# Patient Record
Sex: Male | Born: 1950
Health system: Southern US, Community
[De-identification: ages and names within clinical notes are randomized; demographics above are authoritative.]

## PROBLEM LIST (undated history)

## (undated) DIAGNOSIS — T4145XA Adverse effect of unspecified anesthetic, initial encounter: Secondary | ICD-10-CM

## (undated) DIAGNOSIS — T7840XA Allergy, unspecified, initial encounter: Secondary | ICD-10-CM

## (undated) DIAGNOSIS — Z87442 Personal history of urinary calculi: Secondary | ICD-10-CM

## (undated) DIAGNOSIS — Z8701 Personal history of pneumonia (recurrent): Secondary | ICD-10-CM

## (undated) DIAGNOSIS — N189 Chronic kidney disease, unspecified: Secondary | ICD-10-CM

## (undated) DIAGNOSIS — C61 Malignant neoplasm of prostate: Secondary | ICD-10-CM

## (undated) DIAGNOSIS — H269 Unspecified cataract: Secondary | ICD-10-CM

## (undated) DIAGNOSIS — T8859XA Other complications of anesthesia, initial encounter: Secondary | ICD-10-CM

## (undated) DIAGNOSIS — Z8719 Personal history of other diseases of the digestive system: Secondary | ICD-10-CM

## (undated) DIAGNOSIS — E785 Hyperlipidemia, unspecified: Secondary | ICD-10-CM

## (undated) DIAGNOSIS — R7303 Prediabetes: Secondary | ICD-10-CM

## (undated) DIAGNOSIS — I10 Essential (primary) hypertension: Secondary | ICD-10-CM

## (undated) HISTORY — PX: POLYPECTOMY: SHX149

## (undated) HISTORY — DX: Hyperlipidemia, unspecified: E78.5

## (undated) HISTORY — PX: SHOULDER SURGERY: SHX246

## (undated) HISTORY — DX: Chronic kidney disease, unspecified: N18.9

## (undated) HISTORY — DX: Personal history of other diseases of the digestive system: Z87.19

## (undated) HISTORY — DX: Personal history of urinary calculi: Z87.442

## (undated) HISTORY — DX: Essential (primary) hypertension: I10

## (undated) HISTORY — PX: COLONOSCOPY: SHX174

## (undated) HISTORY — DX: Unspecified cataract: H26.9

## (undated) HISTORY — DX: Allergy, unspecified, initial encounter: T78.40XA

## (undated) HISTORY — DX: Personal history of pneumonia (recurrent): Z87.01

## (undated) HISTORY — DX: Prediabetes: R73.03

---

## 1999-02-23 ENCOUNTER — Inpatient Hospital Stay (HOSPITAL_COMMUNITY): Admission: EM | Admit: 1999-02-23 | Discharge: 1999-02-27 | Payer: Self-pay

## 1999-10-29 ENCOUNTER — Encounter: Payer: Self-pay | Admitting: Gastroenterology

## 2003-01-13 ENCOUNTER — Emergency Department (HOSPITAL_COMMUNITY): Admission: EM | Admit: 2003-01-13 | Discharge: 2003-01-13 | Payer: Self-pay | Admitting: *Deleted

## 2004-01-20 ENCOUNTER — Ambulatory Visit: Payer: Self-pay | Admitting: Internal Medicine

## 2004-01-31 ENCOUNTER — Ambulatory Visit: Payer: Self-pay | Admitting: Internal Medicine

## 2004-02-05 ENCOUNTER — Inpatient Hospital Stay (HOSPITAL_COMMUNITY): Admission: EM | Admit: 2004-02-05 | Discharge: 2004-02-07 | Payer: Self-pay | Admitting: Emergency Medicine

## 2004-02-09 ENCOUNTER — Ambulatory Visit: Payer: Self-pay | Admitting: Internal Medicine

## 2004-02-17 ENCOUNTER — Ambulatory Visit: Payer: Self-pay | Admitting: Internal Medicine

## 2004-02-20 ENCOUNTER — Ambulatory Visit: Payer: Self-pay | Admitting: Gastroenterology

## 2004-03-19 ENCOUNTER — Ambulatory Visit: Payer: Self-pay | Admitting: Internal Medicine

## 2004-04-27 ENCOUNTER — Ambulatory Visit: Payer: Self-pay | Admitting: Internal Medicine

## 2004-12-10 ENCOUNTER — Ambulatory Visit: Payer: Self-pay | Admitting: Gastroenterology

## 2004-12-17 ENCOUNTER — Ambulatory Visit: Payer: Self-pay | Admitting: Gastroenterology

## 2004-12-17 ENCOUNTER — Encounter (INDEPENDENT_AMBULATORY_CARE_PROVIDER_SITE_OTHER): Payer: Self-pay | Admitting: *Deleted

## 2005-02-06 ENCOUNTER — Ambulatory Visit: Payer: Self-pay | Admitting: Internal Medicine

## 2005-02-15 ENCOUNTER — Ambulatory Visit: Payer: Self-pay | Admitting: Internal Medicine

## 2005-08-16 ENCOUNTER — Ambulatory Visit: Payer: Self-pay | Admitting: Internal Medicine

## 2005-11-11 ENCOUNTER — Ambulatory Visit: Payer: Self-pay | Admitting: Internal Medicine

## 2006-04-14 ENCOUNTER — Emergency Department (HOSPITAL_COMMUNITY): Admission: EM | Admit: 2006-04-14 | Discharge: 2006-04-14 | Payer: Self-pay | Admitting: Family Medicine

## 2006-04-23 ENCOUNTER — Emergency Department (HOSPITAL_COMMUNITY): Admission: EM | Admit: 2006-04-23 | Discharge: 2006-04-23 | Payer: Self-pay | Admitting: Family Medicine

## 2006-12-11 ENCOUNTER — Ambulatory Visit: Payer: Self-pay | Admitting: Internal Medicine

## 2006-12-11 LAB — CONVERTED CEMR LAB
ALT: 31 units/L (ref 0–53)
AST: 24 units/L (ref 0–37)
Albumin: 4 g/dL (ref 3.5–5.2)
Alkaline Phosphatase: 59 units/L (ref 39–117)
BUN: 13 mg/dL (ref 6–23)
Basophils Absolute: 0.1 10*3/uL (ref 0.0–0.1)
Basophils Relative: 0.9 % (ref 0.0–1.0)
Bilirubin Urine: NEGATIVE
Bilirubin, Direct: 0.1 mg/dL (ref 0.0–0.3)
CO2: 32 meq/L (ref 19–32)
Calcium: 9.3 mg/dL (ref 8.4–10.5)
Chloride: 103 meq/L (ref 96–112)
Cholesterol: 175 mg/dL (ref 0–200)
Creatinine, Ser: 1.1 mg/dL (ref 0.4–1.5)
Eosinophils Absolute: 0.7 10*3/uL — ABNORMAL HIGH (ref 0.0–0.6)
Eosinophils Relative: 10.4 % — ABNORMAL HIGH (ref 0.0–5.0)
GFR calc Af Amer: 89 mL/min
GFR calc non Af Amer: 73 mL/min
Glucose, Bld: 102 mg/dL — ABNORMAL HIGH (ref 70–99)
Glucose, Urine, Semiquant: NEGATIVE
HCT: 44.4 % (ref 39.0–52.0)
HDL: 36.5 mg/dL — ABNORMAL LOW (ref >39.0)
Hemoglobin: 15.3 g/dL (ref 13.0–17.0)
Ketones, urine, test strip: NEGATIVE
LDL Cholesterol: 111 mg/dL — ABNORMAL HIGH (ref 0–99)
Lymphocytes Relative: 24.8 % (ref 12.0–46.0)
MCHC: 34.4 g/dL (ref 30.0–36.0)
MCV: 88 fL (ref 78.0–100.0)
Monocytes Absolute: 0.7 10*3/uL (ref 0.2–0.7)
Monocytes Relative: 10.7 % (ref 3.0–11.0)
Neutro Abs: 3.2 10*3/uL (ref 1.4–7.7)
Neutrophils Relative %: 53.2 % (ref 43.0–77.0)
Nitrite: NEGATIVE
PSA: 3.02 ng/mL (ref 0.10–4.00)
Platelets: 273 10*3/uL (ref 150–400)
Potassium: 4.2 meq/L (ref 3.5–5.1)
RBC: 5.05 M/uL (ref 4.22–5.81)
RDW: 13 % (ref 11.5–14.6)
Sodium: 142 meq/L (ref 135–145)
Specific Gravity, Urine: 1.02
TSH: 2.75 microintl units/mL (ref 0.35–5.50)
Total Bilirubin: 1 mg/dL (ref 0.3–1.2)
Total CHOL/HDL Ratio: 4.8
Total Protein: 7 g/dL (ref 6.0–8.3)
Triglycerides: 137 mg/dL (ref 0–149)
Urobilinogen, UA: 0.2
VLDL: 27 mg/dL (ref 0–40)
WBC: 6.3 10*3/uL (ref 4.5–10.5)
pH: 7

## 2006-12-24 ENCOUNTER — Ambulatory Visit: Payer: Self-pay | Admitting: Internal Medicine

## 2006-12-24 DIAGNOSIS — I10 Essential (primary) hypertension: Secondary | ICD-10-CM | POA: Insufficient documentation

## 2006-12-24 DIAGNOSIS — R7301 Impaired fasting glucose: Secondary | ICD-10-CM | POA: Insufficient documentation

## 2006-12-24 DIAGNOSIS — Z87442 Personal history of urinary calculi: Secondary | ICD-10-CM | POA: Insufficient documentation

## 2006-12-24 DIAGNOSIS — E785 Hyperlipidemia, unspecified: Secondary | ICD-10-CM | POA: Insufficient documentation

## 2006-12-24 DIAGNOSIS — G479 Sleep disorder, unspecified: Secondary | ICD-10-CM | POA: Insufficient documentation

## 2006-12-24 LAB — CONVERTED CEMR LAB: Blood Glucose, Fingerstick: 94

## 2007-03-02 ENCOUNTER — Ambulatory Visit: Payer: Self-pay | Admitting: Internal Medicine

## 2007-07-06 ENCOUNTER — Ambulatory Visit: Payer: Self-pay | Admitting: Internal Medicine

## 2007-07-06 DIAGNOSIS — R972 Elevated prostate specific antigen [PSA]: Secondary | ICD-10-CM | POA: Insufficient documentation

## 2007-07-06 LAB — CONVERTED CEMR LAB
BUN: 14 mg/dL (ref 6–23)
CO2: 30 meq/L (ref 19–32)
Calcium: 9.2 mg/dL (ref 8.4–10.5)
Chloride: 104 meq/L (ref 96–112)
Cholesterol: 151 mg/dL (ref 0–200)
Creatinine, Ser: 1.1 mg/dL (ref 0.4–1.5)
GFR calc Af Amer: 89 mL/min
GFR calc non Af Amer: 73 mL/min
Glucose, Bld: 97 mg/dL (ref 70–99)
HDL: 34.1 mg/dL — ABNORMAL LOW (ref >39.0)
LDL Cholesterol: 102 mg/dL — ABNORMAL HIGH (ref 0–99)
PSA: 3.23 ng/mL (ref 0.10–4.00)
Potassium: 4.5 meq/L (ref 3.5–5.1)
Sodium: 142 meq/L (ref 135–145)
Total CHOL/HDL Ratio: 4.4
Triglycerides: 73 mg/dL (ref 0–149)
VLDL: 15 mg/dL (ref 0–40)

## 2007-07-13 ENCOUNTER — Ambulatory Visit: Payer: Self-pay | Admitting: Internal Medicine

## 2007-07-13 DIAGNOSIS — N529 Male erectile dysfunction, unspecified: Secondary | ICD-10-CM | POA: Insufficient documentation

## 2007-07-13 DIAGNOSIS — Z8719 Personal history of other diseases of the digestive system: Secondary | ICD-10-CM | POA: Insufficient documentation

## 2007-07-13 DIAGNOSIS — N400 Enlarged prostate without lower urinary tract symptoms: Secondary | ICD-10-CM | POA: Insufficient documentation

## 2007-11-24 ENCOUNTER — Ambulatory Visit: Payer: Self-pay | Admitting: Internal Medicine

## 2007-11-25 ENCOUNTER — Ambulatory Visit: Payer: Self-pay | Admitting: Internal Medicine

## 2007-12-28 ENCOUNTER — Ambulatory Visit: Payer: Self-pay | Admitting: Internal Medicine

## 2007-12-28 LAB — CONVERTED CEMR LAB
ALT: 25 units/L (ref 0–53)
AST: 23 units/L (ref 0–37)
Albumin: 3.7 g/dL (ref 3.5–5.2)
Alkaline Phosphatase: 58 units/L (ref 39–117)
BUN: 14 mg/dL (ref 6–23)
Basophils Absolute: 0 10*3/uL (ref 0.0–0.1)
Basophils Relative: 0.8 % (ref 0.0–3.0)
Bilirubin Urine: NEGATIVE
Bilirubin, Direct: 0.1 mg/dL (ref 0.0–0.3)
CO2: 32 meq/L (ref 19–32)
Calcium: 9.2 mg/dL (ref 8.4–10.5)
Chloride: 103 meq/L (ref 96–112)
Cholesterol: 183 mg/dL (ref 0–200)
Creatinine, Ser: 1.1 mg/dL (ref 0.4–1.5)
Eosinophils Absolute: 0.6 10*3/uL (ref 0.0–0.7)
Eosinophils Relative: 10.8 % — ABNORMAL HIGH (ref 0.0–5.0)
GFR calc Af Amer: 88 mL/min
GFR calc non Af Amer: 73 mL/min
Glucose, Bld: 113 mg/dL — ABNORMAL HIGH (ref 70–99)
Glucose, Urine, Semiquant: NEGATIVE
HCT: 44.9 % (ref 39.0–52.0)
HDL: 45.7 mg/dL (ref >39.0)
Hemoglobin: 15.2 g/dL (ref 13.0–17.0)
Ketones, urine, test strip: NEGATIVE
LDL Cholesterol: 111 mg/dL — ABNORMAL HIGH (ref 0–99)
Lymphocytes Relative: 25.3 % (ref 12.0–46.0)
MCHC: 33.9 g/dL (ref 30.0–36.0)
MCV: 87.5 fL (ref 78.0–100.0)
Monocytes Absolute: 0.6 10*3/uL (ref 0.1–1.0)
Monocytes Relative: 10.2 % (ref 3.0–12.0)
Neutro Abs: 3 10*3/uL (ref 1.4–7.7)
Neutrophils Relative %: 52.9 % (ref 43.0–77.0)
Nitrite: NEGATIVE
PSA: 2.81 ng/mL (ref 0.10–4.00)
Platelets: 233 10*3/uL (ref 150–400)
Potassium: 4 meq/L (ref 3.5–5.1)
Protein, U semiquant: NEGATIVE
RBC: 5.13 M/uL (ref 4.22–5.81)
RDW: 13 % (ref 11.5–14.6)
Sodium: 143 meq/L (ref 135–145)
Specific Gravity, Urine: 1.02
TSH: 3.38 microintl units/mL (ref 0.35–5.50)
Total Bilirubin: 0.8 mg/dL (ref 0.3–1.2)
Total CHOL/HDL Ratio: 4
Total Protein: 7.1 g/dL (ref 6.0–8.3)
Triglycerides: 132 mg/dL (ref 0–149)
Urobilinogen, UA: 0.2
VLDL: 26 mg/dL (ref 0–40)
WBC Urine, dipstick: NEGATIVE
WBC: 5.6 10*3/uL (ref 4.5–10.5)
pH: 5.5

## 2008-01-04 ENCOUNTER — Ambulatory Visit: Payer: Self-pay | Admitting: Internal Medicine

## 2008-05-26 ENCOUNTER — Ambulatory Visit: Payer: Self-pay | Admitting: Internal Medicine

## 2008-05-26 LAB — CONVERTED CEMR LAB: Hgb A1c MFr Bld: 6.2 % (ref 4.6–6.5)

## 2008-06-02 ENCOUNTER — Ambulatory Visit: Payer: Self-pay | Admitting: Internal Medicine

## 2008-08-04 ENCOUNTER — Ambulatory Visit: Payer: Self-pay | Admitting: Internal Medicine

## 2008-08-12 LAB — CONVERTED CEMR LAB
BUN: 16 mg/dL (ref 6–23)
CO2: 27 meq/L (ref 19–32)
Calcium: 8.7 mg/dL (ref 8.4–10.5)
Chloride: 107 meq/L (ref 96–112)
Creatinine, Ser: 1 mg/dL (ref 0.4–1.5)
GFR calc non Af Amer: 81.38 mL/min (ref >60)
Glucose, Bld: 131 mg/dL — ABNORMAL HIGH (ref 70–99)
Potassium: 3.2 meq/L — ABNORMAL LOW (ref 3.5–5.1)
Sodium: 141 meq/L (ref 135–145)

## 2008-12-12 ENCOUNTER — Telehealth: Payer: Self-pay | Admitting: Internal Medicine

## 2008-12-12 ENCOUNTER — Telehealth: Payer: Self-pay | Admitting: Family Medicine

## 2008-12-12 ENCOUNTER — Ambulatory Visit: Payer: Self-pay | Admitting: Internal Medicine

## 2008-12-12 LAB — CONVERTED CEMR LAB: Hemoglobin: 14.7 g/dL

## 2008-12-13 ENCOUNTER — Ambulatory Visit: Payer: Self-pay | Admitting: Gastroenterology

## 2009-01-02 ENCOUNTER — Ambulatory Visit: Payer: Self-pay | Admitting: Internal Medicine

## 2009-01-02 LAB — CONVERTED CEMR LAB
ALT: 27 units/L (ref 0–53)
AST: 28 units/L (ref 0–37)
Albumin: 3.8 g/dL (ref 3.5–5.2)
Alkaline Phosphatase: 56 units/L (ref 39–117)
BUN: 22 mg/dL (ref 6–23)
Basophils Absolute: 0 10*3/uL (ref 0.0–0.1)
Basophils Relative: 0.6 % (ref 0.0–3.0)
Bilirubin Urine: NEGATIVE
Bilirubin, Direct: 0 mg/dL (ref 0.0–0.3)
CO2: 31 meq/L (ref 19–32)
Calcium: 9 mg/dL (ref 8.4–10.5)
Chloride: 104 meq/L (ref 96–112)
Cholesterol: 162 mg/dL (ref 0–200)
Creatinine, Ser: 1.1 mg/dL (ref 0.4–1.5)
Eosinophils Absolute: 0.5 10*3/uL (ref 0.0–0.7)
Eosinophils Relative: 8 % — ABNORMAL HIGH (ref 0.0–5.0)
GFR calc non Af Amer: 72.8 mL/min (ref >60)
Glucose, Bld: 109 mg/dL — ABNORMAL HIGH (ref 70–99)
Glucose, Urine, Semiquant: NEGATIVE
HCT: 41.8 % (ref 39.0–52.0)
HDL: 40.3 mg/dL (ref >39.00)
Hemoglobin: 14.1 g/dL (ref 13.0–17.0)
Ketones, urine, test strip: NEGATIVE
LDL Cholesterol: 99 mg/dL (ref 0–99)
Lymphocytes Relative: 26 % (ref 12.0–46.0)
Lymphs Abs: 1.6 10*3/uL (ref 0.7–4.0)
MCHC: 33.8 g/dL (ref 30.0–36.0)
MCV: 89.8 fL (ref 78.0–100.0)
Monocytes Absolute: 0.6 10*3/uL (ref 0.1–1.0)
Monocytes Relative: 9.6 % (ref 3.0–12.0)
Neutro Abs: 3.4 10*3/uL (ref 1.4–7.7)
Neutrophils Relative %: 55.8 % (ref 43.0–77.0)
Nitrite: NEGATIVE
PSA: 3.88 ng/mL (ref 0.10–4.00)
Platelets: 251 10*3/uL (ref 150.0–400.0)
Potassium: 3.6 meq/L (ref 3.5–5.1)
Protein, U semiquant: NEGATIVE
RBC: 4.66 M/uL (ref 4.22–5.81)
RDW: 13.2 % (ref 11.5–14.6)
Sodium: 143 meq/L (ref 135–145)
Specific Gravity, Urine: <1.005
TSH: 2.98 microintl units/mL (ref 0.35–5.50)
Total Bilirubin: 0.7 mg/dL (ref 0.3–1.2)
Total CHOL/HDL Ratio: 4
Total Protein: 7.3 g/dL (ref 6.0–8.3)
Triglycerides: 115 mg/dL (ref 0.0–149.0)
Urobilinogen, UA: 0.2
VLDL: 23 mg/dL (ref 0.0–40.0)
WBC: 6.1 10*3/uL (ref 4.5–10.5)
pH: 6

## 2009-01-09 ENCOUNTER — Ambulatory Visit: Payer: Self-pay | Admitting: Internal Medicine

## 2009-01-09 DIAGNOSIS — L538 Other specified erythematous conditions: Secondary | ICD-10-CM | POA: Insufficient documentation

## 2009-01-23 ENCOUNTER — Telehealth: Payer: Self-pay | Admitting: *Deleted

## 2009-12-19 ENCOUNTER — Encounter: Payer: Self-pay | Admitting: Gastroenterology

## 2010-01-26 ENCOUNTER — Other Ambulatory Visit: Payer: Self-pay | Admitting: Internal Medicine

## 2010-01-26 ENCOUNTER — Ambulatory Visit
Admission: RE | Admit: 2010-01-26 | Discharge: 2010-01-26 | Payer: Self-pay | Source: Home / Self Care | Attending: Internal Medicine | Admitting: Internal Medicine

## 2010-01-26 LAB — CBC WITH DIFFERENTIAL/PLATELET
Basophils Absolute: 0 10*3/uL (ref 0.0–0.1)
Basophils Relative: 0.4 % (ref 0.0–3.0)
Eosinophils Absolute: 0.5 10*3/uL (ref 0.0–0.7)
Eosinophils Relative: 6.9 % — ABNORMAL HIGH (ref 0.0–5.0)
HCT: 44.4 % (ref 39.0–52.0)
Hemoglobin: 15.1 g/dL (ref 13.0–17.0)
Lymphocytes Relative: 23.4 % (ref 12.0–46.0)
Lymphs Abs: 1.5 10*3/uL (ref 0.7–4.0)
MCHC: 34.1 g/dL (ref 30.0–36.0)
MCV: 87.2 fl (ref 78.0–100.0)
Monocytes Absolute: 0.6 10*3/uL (ref 0.1–1.0)
Monocytes Relative: 8.8 % (ref 3.0–12.0)
Neutro Abs: 4 10*3/uL (ref 1.4–7.7)
Neutrophils Relative %: 60.5 % (ref 43.0–77.0)
Platelets: 268 10*3/uL (ref 150.0–400.0)
RBC: 5.1 Mil/uL (ref 4.22–5.81)
RDW: 13.9 % (ref 11.5–14.6)
WBC: 6.5 10*3/uL (ref 4.5–10.5)

## 2010-01-26 LAB — BASIC METABOLIC PANEL
BUN: 14 mg/dL (ref 6–23)
CO2: 28 mEq/L (ref 19–32)
Calcium: 9.2 mg/dL (ref 8.4–10.5)
Chloride: 104 mEq/L (ref 96–112)
Creatinine, Ser: 1.1 mg/dL (ref 0.4–1.5)
GFR: 70.32 mL/min (ref >60.00)
Glucose, Bld: 103 mg/dL — ABNORMAL HIGH (ref 70–99)
Potassium: 4.3 mEq/L (ref 3.5–5.1)
Sodium: 144 mEq/L (ref 135–145)

## 2010-01-26 LAB — CONVERTED CEMR LAB
Bilirubin Urine: NEGATIVE
Glucose, Urine, Semiquant: NEGATIVE
Ketones, urine, test strip: NEGATIVE
Nitrite: NEGATIVE
Protein, U semiquant: NEGATIVE
Specific Gravity, Urine: 1.02
Urobilinogen, UA: 0.2
pH: 7

## 2010-01-26 LAB — LIPID PANEL
Cholesterol: 152 mg/dL (ref 0–200)
HDL: 43.6 mg/dL (ref >39.00)
LDL Cholesterol: 90 mg/dL (ref 0–99)
Total CHOL/HDL Ratio: 3
Triglycerides: 90 mg/dL (ref 0.0–149.0)
VLDL: 18 mg/dL (ref 0.0–40.0)

## 2010-01-26 LAB — HEPATIC FUNCTION PANEL
ALT: 23 U/L (ref 0–53)
AST: 24 U/L (ref 0–37)
Albumin: 3.7 g/dL (ref 3.5–5.2)
Alkaline Phosphatase: 53 U/L (ref 39–117)
Bilirubin, Direct: 0.1 mg/dL (ref 0.0–0.3)
Total Bilirubin: 0.8 mg/dL (ref 0.3–1.2)
Total Protein: 6.8 g/dL (ref 6.0–8.3)

## 2010-01-26 LAB — TSH: TSH: 2.21 u[IU]/mL (ref 0.35–5.50)

## 2010-02-05 ENCOUNTER — Ambulatory Visit
Admission: RE | Admit: 2010-02-05 | Discharge: 2010-02-05 | Payer: Self-pay | Source: Home / Self Care | Attending: Internal Medicine | Admitting: Internal Medicine

## 2010-02-05 DIAGNOSIS — E669 Obesity, unspecified: Secondary | ICD-10-CM | POA: Insufficient documentation

## 2010-02-20 NOTE — Letter (Signed)
Summary: Colonoscopy Letter  [facility]  [address]   [place], [place] [address]   Phone: [phone]  Fax: [phone]      December 19, 2009 MRN: [id number]   [facility] [address] [place], [place]  [address]   Dear Mr. Jablon,   According to your medical record, it is time for you to schedule a Colonoscopy. The American Cancer Society recommends this procedure as a method to detect early colon cancer. Patients with a family history of colon cancer, or a personal history of colon polyps or inflammatory bowel disease are at increased risk.  This letter has been generated based on the recommendations made at the time of your procedure. If you feel that in your particular situation this may no longer apply, please contact our office.  Please call our office at [phone] to schedule this appointment or to update your records at your earliest convenience.  Thank you for cooperating with [place] to provide you with the very best care possible.   Sincerely,   Barbette Hair. Arlyce Dice, [name]  [facility] [phone]

## 2010-02-20 NOTE — Progress Notes (Signed)
Summary: refill on vytorin  Phone Note Call from Patient Call back at Home Phone [phone]   Caller: Patient Summary of Call: Vytorin 10/40 needs to be sent to [facility] Initial call taken by: Romualdo Bolk, CMA (AAMA),  January 23, 2009 5:13 PM  Follow-up for Phone Call        Left a message on pt's machine about sending the rx to [facility] Follow-up by: Romualdo Bolk, CMA (AAMA),  January 23, 2009 5:15 PM    Prescriptions: VYTORIN 10-40 MG  TABS (EZETIMIBE-SIMVASTATIN) 1 by mouth once daily  #90 x 3   Entered by:   Romualdo Bolk, CMA (AAMA)   Authorized by:   Madelin Headings MD   Signed by:   Romualdo Bolk, CMA (AAMA) on 01/23/2009   Method used:   Electronically to        [facility]* (mail-order)             ,          Ph: [phone]       Fax: [phone]   RxID:   [id number]

## 2010-02-22 NOTE — Assessment & Plan Note (Signed)
Summary: CPX//SLM/PT [facility] FROM [facility]   Vital Signs:  Patient profile:   60 year old male Height:      65 inches Weight:      224 pounds BMI:     37.41 Pulse rate:   78 / minute BP sitting:   140 / 90  (left arm) Cuff size:   regular  Vitals Entered By: Romualdo Bolk, CMA (AAMA) (February 05, 2010 9:10 AM)  Nutrition Counseling: Patient's BMI is greater than 25 and therefore counseled on weight management options.  Serial Vital Signs/Assessments:  Time      Position  BP       Pulse  Resp  Temp     By                     130/82                         Madelin Headings MD  Comments: r sitting By: Madelin Headings MD   CC: CPX   History of Present Illness: Daniel Randall comes in today  for preventive visit.  Since last visit  here  there have been no major changes in health status  . BP: taking meds LIPIDS: no chang ein meds. No renal stone . No exercise intolerance  walking more and had lost more weight before the holidays . due for colonoscopy.     Preventive Care Screening  Prior Values:    PSA:  3.88 (01/02/2009)    Colonoscopy:  Results: Polyp.  Results: Diverticulosis.       Location:  [facility].   (12/17/2004)    Last Tetanus Booster:  Td (10/21/2001)   Preventive Screening-Counseling & Management  Alcohol-Tobacco     Alcohol drinks/day: <1     Alcohol type: wine     Smoking Status: never  Caffeine-Diet-Exercise     Caffeine use/day: 1-2     Does Patient Exercise: yes  Hep-HIV-STD-Contraception     Dental Visit-last 6 months yes     Sun Exposure-Excessive: no  Safety-Violence-Falls     Seat Belt Use: 100     Firearms in the Home: firearms in the home     Firearm Counseling: not indicated; uses recommended firearm safety measures     Smoke Detectors: yes  Current Medications (verified): 1)  Vytorin 10-40 Mg  Tabs (Ezetimibe-Simvastatin) .Marland Kitchen.. 1 By Mouth Once Daily 2)  Flonase 50 Mcg/act Susp (Fluticasone Propionate) .... 2  Spray Each Nares Q D 3)  Diovan Hct 320-25 Mg Tabs (Valsartan-Hydrochlorothiazide) .Marland Kitchen.. 1 By Mouth Once Daily 4)  Ketoconazole 2 % Crea (Ketoconazole) .... Apply To Rash Two Times A Day As Directed  Allergies (verified): 1)  ! Accupril 2)  ! * Bee Stings  Past History:  Past medical, surgical, family and social histories (including risk factors) reviewed, and no changes noted (except as noted below).  Past Medical History: Reviewed history from 12/13/2008 and no changes required. Hyperlipidemia Hypertension Nephrolithiasis, hx of hosp pneumonia 2001 Diverticulitis, hx of 1/06  Hx of colon polyps 2006 diverticulosis 2001       LAST Td: 10/2001 Colonscopy: 12/17/2004 EKG: 01/31/2004 [facility]  Kyra Searles  Past Surgical History: Reviewed history from 01/04/2008 and no changes required. Left Shoulder Arthroscopy  [place].  Past History:  Care Management: Dermatology: Joseph Art Gastroenterology: Arlyce Dice  Family History: Reviewed history from 01/04/2008 and no changes required. Family History of Colon CA 1st degree relative  Father Family History Diabetes 1st degree relative MOM ? sis ADHD  children   Father:  Wellsite geologist  Mother: DM Siblings:  older bro and sister  borderline DM  Social History: Reviewed history from 12/13/2008 and no changes required. Never Smoked Alcohol use-yes couple times a month Drug use-no Regular exercise-no  starting now   Married 2 cats and a dog.  HH of 3  2 older children   son at home currently  Occupation: Engineer, manufacturing systems Care w/in 6 mos.:  yes  Review of Systems       12 sytem review neg cv pulm  Gi currently orthos stable. vision and hearing ok. No cp sob or exercise limitations. nocturia x 1 -2 ocass hesitancy no blood no renal stones.  Physical Exam  General:  Well-developed,well-nourished,in no acute distress; alert,appropriate and cooperative throughout examination Head:  normocephalic, atraumatic, and no  abnormalities observed.   Eyes:  vision grossly intact, pupils equal, pupils round, and pupils reactive to light.   Ears:  R ear normal, L ear normal, and no external deformities.   Nose:  no external deformity, no external erythema, and no nasal discharge.   Mouth:  pharynx pink and moist.   Neck:  No deformities, masses, or tenderness noted. Lungs:  Normal respiratory effort, chest expands symmetrically. Lungs are clear to auscultation, no crackles or wheezes.no dullness.   Heart:  Normal rate and regular rhythm. S1 and S2 normal without gallop, murmur, click, rub or other extra sounds.no lifts.   Abdomen:  Bowel sounds positive,abdomen soft and non-tender without masses, organomegaly or hernias noted. Rectal:  no external abnormalities, normal sphincter tone, and no masses.   Prostate:  no nodules, no asymmetry, and 1+ enlarged.   Msk:  no joint swelling, no joint warmth, no redness over joints, and no joint deformities.   Pulses:  pulses intact without delay  no brutis Extremities:  no clubbing cyanosis or edema  Neurologic:  alert & oriented X3, strength normal in all extremities, and gait normal.   Skin:  turgor normal, color normal, no ecchymoses, and no petechiae.   Cervical Nodes:  No lymphadenopathy noted Axillary Nodes:  No palpable lymphadenopathy Inguinal Nodes:  No significant adenopathy Psych:  Oriented X3, normally interactive, good eye contact, not anxious appearing, and not depressed appearing.   labs reviewed with patient  Impression & Recommendations:  Problem # 1:  PREVENTIVE HEALTH CARE (ICD-V70.0)  Reviewed preventive care protocols, scheduled due services, and updated immunizations.  Problem # 2:  HYPERTENSION (ICD-401.9) repeat bp 130/82 right arm large sitting and controlled  continue losing weight His updated medication list for this problem includes:    Diovan Hct 320-25 Mg Tabs (Valsartan-hydrochlorothiazide) .Marland Kitchen... 1 by mouth once daily  BP today:  140/90 Prior BP: 110/80 (01/09/2009)  Prior 10 Yr Risk Heart Disease: 9 % (08/04/2008)  Labs Reviewed: K+: 4.3 (01/26/2010) Creat: : 1.1 (01/26/2010)   Chol: 152 (01/26/2010)   HDL: 43.60 (01/26/2010)   LDL: 90 (01/26/2010)   TG: 90.0 (01/26/2010)  Problem # 3:  OBESITY (ICD-278.00) continue weight loss  Ht: 65 (02/05/2010)   Wt: 224 (02/05/2010)   BMI: 37.41 (02/05/2010)  Problem # 4:  HYPERTROPHY PROSTATE W/O UR OBST & OTH LUTS (ICD-600.00) Assessment: Unchanged nocturia x 1-2  no change some sloght change in flow  no alarm features on exam . Expectant management .    Problem # 5:  FAMILY HISTORY OF COLON CA 1ST DEGREE RELATIVE <60 (ICD-V16.0) due for colonoscopy  Complete Medication List: 1)  Vytorin 10-40 Mg Tabs (Ezetimibe-simvastatin) .Marland Kitchen.. 1 by mouth once daily 2)  Flonase 50 Mcg/act Susp (Fluticasone propionate) .... 2 spray each nares q d 3)  Diovan Hct 320-25 Mg Tabs (Valsartan-hydrochlorothiazide) .Marland Kitchen.. 1 by mouth once daily 4)  Ketoconazole 2 % Crea (Ketoconazole) .... Apply to rash two times a day as directed  Patient Instructions: 1)  Schedule your colonoscopy  2)  call if  any progressive urinary signs . 3)  continue exercise and weight loss to help your medical conditinos and your health.  4)  no change in meds at this time. 5)  return office visit in 6 months.    Orders Added: 1)  Est. Patient 40-64 years [[id number]]

## 2010-03-01 ENCOUNTER — Encounter (INDEPENDENT_AMBULATORY_CARE_PROVIDER_SITE_OTHER): Payer: Self-pay | Admitting: *Deleted

## 2010-03-06 ENCOUNTER — Other Ambulatory Visit: Payer: Self-pay | Admitting: Internal Medicine

## 2010-03-08 NOTE — Letter (Signed)
Summary: Pre Visit Letter Revised  [facility]  [address] [place], [place] [address]   Phone: [phone]  Fax: [phone]        03/01/2010 MRN: [id number] [facility] [address] [place], [place]  [address]             Procedure Date:  04/13/2010 @ 8:00   Recall colon-Dr. Arlyce Dice   Welcome to the Gastroenterology Division at [facility].    You are scheduled to see a nurse for your pre-procedure visit on 03/30/2010 at 4:30 on the 3rd floor at [facility], [address]  We ask that you try to arrive at our office 15 minutes prior to your appointment time to allow for check-in.  Please take a minute to review the attached form.  If you answer "Yes" to one or more of the questions on the first page, we ask that you call the person listed at your earliest opportunity.  If you answer "No" to all of the questions, please complete the rest of the form and bring it to your appointment.    Your nurse visit will consist of discussing your medical and surgical history, your immediate family medical history, and your medications.   If you are unable to list all of your medications on the form, please bring the medication bottles to your appointment and we will list them.  We will need to be aware of both prescribed and over the counter drugs.  We will need to know exact dosage information as well.    Please be prepared to read and sign documents such as consent forms, a financial agreement, and acknowledgement forms.  If necessary, and with your consent, a friend or relative is welcome to sit-in on the nurse visit with you.  Please bring your insurance card so that we may make a copy of it.  If your insurance requires a referral to see a specialist, please bring your referral form from your primary care physician.  No co-pay is required for this nurse visit.     If you cannot keep your appointment, please call [phone] to cancel or reschedule prior to  your appointment date.  This allows [place] the opportunity to schedule an appointment for another patient in need of care.    Thank you for choosing [facility] for your medical needs.  We appreciate the opportunity to care for you.  Please visit [place] at our website  to learn more about our practice.  Sincerely, The Gastroenterology Division

## 2010-03-22 LAB — HM COLONOSCOPY

## 2010-03-28 ENCOUNTER — Encounter (INDEPENDENT_AMBULATORY_CARE_PROVIDER_SITE_OTHER): Payer: Self-pay | Admitting: *Deleted

## 2010-03-30 ENCOUNTER — Encounter: Payer: Self-pay | Admitting: Gastroenterology

## 2010-04-03 NOTE — Letter (Signed)
Summary: [facility] Instructions  [facility]  [address] [place], [place] [address]   Phone: [phone]  Fax: [phone]       KENTLEY BLYDEN    07/03/1950    MRN: [id number]        Procedure Day /Date:  Friday 04/13/2010     Arrival Time: 7:30 am     Procedure Time: 8:00 am    Location of Procedure:                    _ x_  [facility] (4th Floor)   PREPARATION FOR COLONOSCOPY WITH MOVIPREP   Starting 5 days prior to your procedure Sunday 3/18 do not eat nuts, seeds, popcorn, corn, beans, peas,  salads, or any raw vegetables.  Do not take any fiber supplements (e.g. Metamucil, Citrucel, and Benefiber).  THE DAY BEFORE YOUR PROCEDURE         DATE: Thursday 3/22  1.  Drink clear liquids the entire day-NO SOLID FOOD  2.  Do not drink anything colored red or purple.  Avoid juices with pulp.  No orange juice.  3.  Drink at least 64 oz. (8 glasses) of fluid/clear liquids during the day to prevent dehydration and help the prep work efficiently.  CLEAR LIQUIDS INCLUDE: Water Jello Ice Popsicles Tea (sugar ok, no milk/cream) Powdered fruit flavored drinks Coffee (sugar ok, no milk/cream) Gatorade Juice: apple, white grape, white cranberry  Lemonade Clear bullion, consomm, broth Carbonated beverages (any kind) Strained chicken noodle soup Hard Candy                             4.  In the morning, mix first dose of MoviPrep solution:    Empty 1 Pouch A and 1 Pouch B into the disposable container    Add lukewarm drinking water to the top line of the container. Mix to dissolve    Refrigerate (mixed solution should be used within 24 hrs)  5.  Begin drinking the prep at 5:00 p.m. The MoviPrep container is divided by 4 marks.   Every 15 minutes drink the solution down to the next mark (approximately 8 oz) until the full liter is complete.   6.  Follow completed prep with 16 oz of clear liquid of your choice (Nothing red or purple).  Continue  to drink clear liquids until bedtime.  7.  Before going to bed, mix second dose of MoviPrep solution:    Empty 1 Pouch A and 1 Pouch B into the disposable container    Add lukewarm drinking water to the top line of the container. Mix to dissolve    Refrigerate  THE DAY OF YOUR PROCEDURE      DATE: Friday 3/23  Beginning at 3:00 a.m. (5 hours before procedure):         1. Every 15 minutes, drink the solution down to the next mark (approx 8 oz) until the full liter is complete.  2. Follow completed prep with 16 oz. of clear liquid of your choice.    3. You may drink clear liquids until 6:00 am (2 HOURS BEFORE PROCEDURE).   MEDICATION INSTRUCTIONS  Unless otherwise instructed, you should take regular prescription medications with a small sip of water   as early as possible the morning of your procedure.           OTHER INSTRUCTIONS  You will need a responsible adult at least  61 years of age to accompany you and drive you home.   This person must remain in the waiting room during your procedure.  Wear loose fitting clothing that is easily removed.  Leave jewelry and other valuables at home.  However, you may wish to bring a book to read or  an iPod/MP3 player to listen to music as you wait for your procedure to start.  Remove all body piercing jewelry and leave at home.  Total time from sign-in until discharge is approximately 2-3 hours.  You should go home directly after your procedure and rest.  You can resume normal activities the  day after your procedure.  The day of your procedure you should not:   Drive   Make legal decisions   Operate machinery   Drink alcohol   Return to work  You will receive specific instructions about eating, activities and medications before you leave.    The above instructions have been reviewed and explained to me by   Sherren Kerns RN  March 30, 2010 4:34 PM    I fully understand and can verbalize these instructions  _____________________________ Date _________

## 2010-04-03 NOTE — Miscellaneous (Signed)
Summary: previsit prep/rm  Clinical Lists Changes  Medications: Added new medication of MOVIPREP 100 GM  SOLR (PEG-KCL-NACL-NASULF-NA ASC-C) As per prep instructions. - Signed Rx of MOVIPREP 100 GM  SOLR (PEG-KCL-NACL-NASULF-NA ASC-C) As per prep instructions.;  #1 x 0;  Signed;  Entered by: Sherren Kerns RN;  Authorized by: Louis Meckel MD;  Method used: Electronically to [facility]  [phone]*, [address] [place], [place], [place]  [address], Ph: [phone] or [phone], Fax: [phone] Observations: Added new observation of ALLERGY REV: Done (03/30/2010 16:15)    Prescriptions: MOVIPREP 100 GM  SOLR (PEG-KCL-NACL-NASULF-NA ASC-C) As per prep instructions.  #1 x 0   Entered by:   Sherren Kerns RN   Authorized by:   Louis Meckel MD   Signed by:   Sherren Kerns RN on 03/30/2010   Method used:   Electronically to        [facility]  [phone]* (retail)       [address]       [place], [place]  [address]       Ph: [phone] or [phone]       Fax: [phone]   RxID:   [id number]

## 2010-04-12 ENCOUNTER — Encounter: Payer: Self-pay | Admitting: *Deleted

## 2010-04-13 ENCOUNTER — Encounter: Payer: Self-pay | Admitting: Gastroenterology

## 2010-04-13 ENCOUNTER — Ambulatory Visit (AMBULATORY_SURGERY_CENTER): Payer: BC Managed Care – PPO | Admitting: Gastroenterology

## 2010-04-13 VITALS — BP 130/75 | HR 68 | Temp 97.3°F | Resp 16 | Ht 65.0 in | Wt 227.0 lb

## 2010-04-13 DIAGNOSIS — Z860100 Personal history of colon polyps, unspecified: Secondary | ICD-10-CM

## 2010-04-13 DIAGNOSIS — Z1211 Encounter for screening for malignant neoplasm of colon: Secondary | ICD-10-CM

## 2010-04-13 DIAGNOSIS — K5730 Diverticulosis of large intestine without perforation or abscess without bleeding: Secondary | ICD-10-CM

## 2010-04-13 NOTE — Patient Instructions (Signed)
[employer] reviewed with patient and care partner. Discharge Instructions signed by care partner. Diverticulosis and High Fiber Diet Instructions given to care partner.  Impressions and Recommendations: Diverticulosis. Repeat colonoscopy in 7 years.

## 2010-04-16 ENCOUNTER — Telehealth: Payer: Self-pay | Admitting: *Deleted

## 2010-04-16 NOTE — Telephone Encounter (Signed)

## 2010-04-19 NOTE — Procedures (Signed)
Summary: Colonoscopy  Patient: Monti Jilek Note: All result statuses are Final unless otherwise noted.  Tests: (1) Colonoscopy (COL)   COL Colonoscopy           DONE      [facility]     [address]     [place], [place]  [address]          COLONOSCOPY PROCEDURE REPORT          PATIENT:  Wendall, Isabell  MR#:  [id number]     BIRTHDATE:  Sep 11, 1950, 59 yrs. old  GENDER:  male          ENDOSCOPIST:  Barbette Hair. Arlyce Dice, MD     Referred by:          PROCEDURE DATE:  04/13/2010     PROCEDURE:  Diagnostic Colonoscopy     ASA CLASS:  Class II     INDICATIONS:  1) screening  2) history of pre-cancerous     (adenomatous) colon polyps Index polypectomy 2006          MEDICATIONS:   Fentanyl 75 mcg IV, Versed 7 mg IV          DESCRIPTION OF PROCEDURE:   After the risks benefits and     alternatives of the procedure were thoroughly explained, informed     consent was obtained.  Digital rectal exam was performed and     revealed no abnormalities.   The LB 180AL [id number] endoscope was     introduced through the anus and advanced to the cecum, which was     identified by both the appendix and ileocecal valve, without     limitations.  The quality of the prep was excellent, using     MoviPrep.  The instrument was then slowly withdrawn as the colon     was fully examined.     <<PROCEDUREIMAGES>>          FINDINGS:  Moderate diverticulosis was found in the sigmoid to     descending colon segments (see image1 and image2).  This was     otherwise a normal examination of the colon (see image3, image4,     image5, image6, image12, and image13).   Retroflexed views in the     rectum revealed no abnormalities.    The time to cecum =  3.0     minutes. The scope was then withdrawn (time =  7.0  min) from the     patient and the procedure completed.          COMPLICATIONS:  None          ENDOSCOPIC IMPRESSION:     1) Moderate diverticulosis in the sigmoid to descending colon  segments     2) Otherwise normal examination     RECOMMENDATIONS:     1) Colonoscopy 7 years          REPEAT EXAM:   7 year(s) Colonoscopy          ______________________________     Barbette Hair. Arlyce Dice, MD          CC:  Madelin Headings, MD          n.     Rosalie DoctorBarbette Hair. Nevaeh Casillas at 04/13/2010 09:24 AM          Florentina Jenny, [phone]  Note: An exclamation mark (!) indicates a result that was not dispersed into the flowsheet. Document Creation Date: 04/13/2010 9:24 AM _______________________________________________________________________  (1)  Order result status: Final Collection or observation date-time: 04/13/2010 09:16 Requested date-time:  Receipt date-time:  Reported date-time:  Referring Physician:   Ordering Physician: Melvia Heaps [phone]) Specimen Source:  Source: Launa Grill Order Number: [id number] Lab site:

## 2010-06-08 NOTE — H&P (Signed)
NAMECHUCKY, HOMES                ACCOUNT NO.:  [id number]   MEDICAL RECORD NO.:  [id number]          PATIENT TYPE:  EMS   LOCATION:  ED                           FACILITY:  [facility]   PHYSICIAN:  Titus Dubin. Alwyn Ren, [name] [facility] OF BIRTH:  [birth year]   DATE OF ADMISSION:  02/04/2004  DATE OF DISCHARGE:                                HISTORY & PHYSICAL   HISTORY OF PRESENT ILLNESS:  Daniel Randall is a 60 year old white male being  admitted for IV therapy with antibiotics and analgesics for acute  diverticulitis associated with severe pain and tachycardia.  He will be  admitted to 23-hour observation.   He began to have acute abdominal pain approximately 3 a.m. this morning.  It  has been progressive throughout the day to the point that it is now an 8 on  a 10 scale.  He had diarrhea which was essentially loose stool on three  occasions.  He denies any fever, chills, or sweats.   He was concerned this represented a renal calculus, as he had such in 2004.  He also had pneumonia in 2000 and outpatient shoulder surgery in 2001.  A  colonoscopy was negative at age 60.   MEDICATIONS:  1.  He is on Lipitor.  2.  A blood pressure pill, which is probably Maxzide.   ALLERGIES:  He has no known drug allergies.   HABITS:  He does not drink or smoke.   FAMILY HISTORY:  Mother has diabetes.  His brother has prostate cancer.  His  father had colon cancer.  His sister had diverticulitis and a partial  colectomy for recurrent diverticulitis.  A sister has borderline diabetes.   REVIEW OF SYSTEMS:  Otherwise negative with no cardiopulmonary or  genitourinary symptoms other than the abdominal pain.   PHYSICAL EXAMINATION:  VITAL SIGNS:  He has a resting pulse of 116.  Blood  pressure is 104/50.  Respiratory rate is 22.  HEENT:  Pattern alopecia is present.  He has arterial narrowing.  Tongue is  dry and somewhat erythematous.  NEUROLOGIC:  The remainder of the neurologic exam is unremarkable.  LYMPH NODES:  No adenopathy about the head or axilla.  LUNGS:  There is no increased work of breathing.  CARDIOLOGY:  He has an S4 tach.  ABDOMEN:  Reveals scattered bowel sounds.  He is exquisitely tender in the  suprapubic area.  EXTREMITIES:  Pedal pulses are intact.  SKIN:  Warm and dry without tenting.   LABORATORY DATA:  A CT shows proximal sigmoid thickening and inflammation  compatible with diverticulitis.  No abscess, free fluid, or small bowel  obstruction is seen.  White count is high normal at 10,500 with absolute  granulocyte count of 8.6 with 82% neutrophils, suggesting an early left  shift.  Glucose is 122.  Urinalysis is negative.   PLAN:  He has received some pain relief with IV Dilaudid. Cipro and Flagyl  have been initiated.  He will be admitted to observation for 23 hours  because of the relative hypotension and tachycardia and early left shift.  If he is  stable in that period of time, he will be discharged on oral  medications.  At this time he will be kept NPO except for ice chips.      [facility]/MEDQ  D:  02/04/2004  T:  02/04/2004  Job:  [id number]   cc:   Neta Mends. Fabian Sharp, [name] [facility]

## 2010-06-08 NOTE — Discharge Summary (Signed)
NAMESHANNA, STRENGTH                ACCOUNT NO.:  [id number]   MEDICAL RECORD NO.:  [id number]          PATIENT TYPE:  INP   LOCATION:  0347                         FACILITY:  [facility]   PHYSICIAN:  Rene Paci, [name] LHCDATE OF BIRTH:  [birth year]   DATE OF ADMISSION:  02/04/2004  DATE OF DISCHARGE:  02/07/2004                                 DISCHARGE SUMMARY   DISCHARGE DIAGNOSES:  1.  Diverticulitis with fever and abdominal pain, question small abscess by      CT scan, but afebrile, tolerating p.o.  Continue outpatient treatment      minimum two weeks with close outpatient follow-up.  2.  Hyperglycemia with normal A1C.  3.  Hypertension.  4.  History of dyslipidemia.   DISCHARGE MEDICATIONS:  1.  Cipro 500 mg p.o. b.i.d. x12 additional days.  2.  Flagyl 500 mg p.o. t.i.d. x12 additional days.  3.  Other medications are to resume as prior to admission which include      Lipitor and a blood pressure pill the patient believes is Maxzide.  4.  He is also given a prescription for Vicodin one to two q.4-6h. p.r.n.   CONDITION ON DISCHARGE:  Medically stable, afebrile, tolerating p.o. food  and medications.  Pain controlled.  Hospital follow-up has been arranged  with primary care physician, Dr. Berniece Andreas for Thursday, January 19 at  9:15 a.m. (next 48 hours) and patient is given instructions to return to  emergency room or call [name] if worsening fever or unable to tolerate p.o.   HOSPITAL COURSE:  ACUTE DIVERTICULITIS:  Patient is a 60 year old gentleman  with progressive lower abdominal pain rated 8/10, but disabling patient from  activity and intake, loose stool x3 episodes, but no fever or chill, no  previous history of same.  CT scan showed proximal sigmoid thickening and  inflammation consistent with diverticulitis.  No abscess seen.  Admitted for  IV antibiotics and observation, but continued to have fevers and pains and  thus full admission for longer duration of IV  antibiotics.  By the third day  patient showed decreased pain and was tolerating clears, anxious to return.  Repeat CT scan ordered for January 16 showed question of small abscess.  However, in the face of resolved fever and normalized white count, it was  felt that the antibiotics were adequately controlling infectious symptoms.  He was changed to p.o. Flagyl and Cipro which he has tolerated and as he is  very anxious to return home and to work arrangements were made to have  patient closely followed as an outpatient with the understanding of  conditions as outlined above of which to return for further inpatient  treatment.   CONDITION ON DISCHARGE:  Improved.  All other medical issues are as prior to  admission.  No other changes were made to his regimen.    VL/MEDQ  D:  02/15/2004  T:  02/15/2004  Job:  [id number]

## 2010-08-15 ENCOUNTER — Other Ambulatory Visit: Payer: Self-pay | Admitting: Internal Medicine

## 2010-08-19 ENCOUNTER — Other Ambulatory Visit: Payer: Self-pay | Admitting: Internal Medicine

## 2010-11-08 ENCOUNTER — Other Ambulatory Visit: Payer: Self-pay | Admitting: Internal Medicine

## 2011-01-02 ENCOUNTER — Telehealth: Payer: Self-pay

## 2011-01-02 MED ORDER — VALSARTAN-HYDROCHLOROTHIAZIDE 320-25 MG PO TABS: ORAL_TABLET | ORAL | Status: DC

## 2011-01-02 NOTE — Telephone Encounter (Signed)
Pt states has a refill request for Diovan.  Pt states an appt could not be scheduled for his annual until April 2013. Pt will run out of medication in about 3 weeks. Pt is requesting medication be called in to [facility].  Pls advise.

## 2011-01-04 ENCOUNTER — Telehealth: Payer: Self-pay | Admitting: Internal Medicine

## 2011-01-04 MED ORDER — VALSARTAN-HYDROCHLOROTHIAZIDE 320-25 MG PO TABS: ORAL_TABLET | ORAL | Status: DC

## 2011-01-04 NOTE — Telephone Encounter (Signed)
Pt coming in in April for Cpx and is needing a refill on valsartan-hydrochlorothiazide (DIOVAN HCT) 320-25 MG per tablet  Please send to [facility]

## 2011-01-04 NOTE — Telephone Encounter (Signed)
Rx sent to pharmacy   

## 2011-02-06 ENCOUNTER — Other Ambulatory Visit: Payer: Self-pay | Admitting: Internal Medicine

## 2011-04-30 ENCOUNTER — Other Ambulatory Visit (INDEPENDENT_AMBULATORY_CARE_PROVIDER_SITE_OTHER): Payer: BC Managed Care – PPO

## 2011-04-30 DIAGNOSIS — Z0000 Encounter for general adult medical examination without abnormal findings: Secondary | ICD-10-CM

## 2011-04-30 LAB — HEPATIC FUNCTION PANEL
ALT: 23 U/L (ref 0–53)
AST: 25 U/L (ref 0–37)
Albumin: 4.1 g/dL (ref 3.5–5.2)
Alkaline Phosphatase: 54 U/L (ref 39–117)
Bilirubin, Direct: 0.1 mg/dL (ref 0.0–0.3)
Total Bilirubin: 0.4 mg/dL (ref 0.3–1.2)
Total Protein: 7.1 g/dL (ref 6.0–8.3)

## 2011-04-30 LAB — LIPID PANEL
Cholesterol: 159 mg/dL (ref 0–200)
HDL: 41.9 mg/dL (ref >39.00)
LDL Cholesterol: 102 mg/dL — ABNORMAL HIGH (ref 0–99)
Total CHOL/HDL Ratio: 4
Triglycerides: 74 mg/dL (ref 0.0–149.0)
VLDL: 14.8 mg/dL (ref 0.0–40.0)

## 2011-04-30 LAB — BASIC METABOLIC PANEL
BUN: 24 mg/dL — ABNORMAL HIGH (ref 6–23)
CO2: 27 mEq/L (ref 19–32)
Calcium: 9.2 mg/dL (ref 8.4–10.5)
Chloride: 103 mEq/L (ref 96–112)
Creatinine, Ser: 1.1 mg/dL (ref 0.4–1.5)
GFR: 69.31 mL/min (ref >60.00)
Glucose, Bld: 98 mg/dL (ref 70–99)
Potassium: 3.5 mEq/L (ref 3.5–5.1)
Sodium: 141 mEq/L (ref 135–145)

## 2011-04-30 LAB — CBC WITH DIFFERENTIAL/PLATELET
Basophils Absolute: 0 10*3/uL (ref 0.0–0.1)
Basophils Relative: 0.6 % (ref 0.0–3.0)
Eosinophils Absolute: 0.3 10*3/uL (ref 0.0–0.7)
Eosinophils Relative: 5.4 % — ABNORMAL HIGH (ref 0.0–5.0)
HCT: 43.2 % (ref 39.0–52.0)
Hemoglobin: 14.3 g/dL (ref 13.0–17.0)
Lymphocytes Relative: 28.9 % (ref 12.0–46.0)
Lymphs Abs: 1.9 10*3/uL (ref 0.7–4.0)
MCHC: 33.2 g/dL (ref 30.0–36.0)
MCV: 88.1 fl (ref 78.0–100.0)
Monocytes Absolute: 0.8 10*3/uL (ref 0.1–1.0)
Monocytes Relative: 12.8 % — ABNORMAL HIGH (ref 3.0–12.0)
Neutro Abs: 3.4 10*3/uL (ref 1.4–7.7)
Neutrophils Relative %: 52.3 % (ref 43.0–77.0)
Platelets: 252 10*3/uL (ref 150.0–400.0)
RBC: 4.9 Mil/uL (ref 4.22–5.81)
RDW: 13.8 % (ref 11.5–14.6)
WBC: 6.4 10*3/uL (ref 4.5–10.5)

## 2011-04-30 LAB — POCT URINALYSIS DIPSTICK
Ketones, UA: NEGATIVE
Protein, UA: NEGATIVE
Spec Grav, UA: 1.015
Urobilinogen, UA: 0.2
pH, UA: 6.5

## 2011-04-30 LAB — PSA: PSA: 4.64 ng/mL — ABNORMAL HIGH (ref 0.10–4.00)

## 2011-04-30 LAB — TSH: TSH: 2.54 u[IU]/mL (ref 0.35–5.50)

## 2011-05-07 ENCOUNTER — Ambulatory Visit (INDEPENDENT_AMBULATORY_CARE_PROVIDER_SITE_OTHER): Payer: BC Managed Care – PPO | Admitting: Internal Medicine

## 2011-05-07 ENCOUNTER — Encounter: Payer: Self-pay | Admitting: Internal Medicine

## 2011-05-07 VITALS — BP 128/76 | HR 82 | Temp 98.4°F | Ht 65.5 in | Wt 238.0 lb

## 2011-05-07 DIAGNOSIS — I10 Essential (primary) hypertension: Secondary | ICD-10-CM

## 2011-05-07 DIAGNOSIS — Z0000 Encounter for general adult medical examination without abnormal findings: Secondary | ICD-10-CM

## 2011-05-07 DIAGNOSIS — J309 Allergic rhinitis, unspecified: Secondary | ICD-10-CM

## 2011-05-07 DIAGNOSIS — R972 Elevated prostate specific antigen [PSA]: Secondary | ICD-10-CM

## 2011-05-07 DIAGNOSIS — E669 Obesity, unspecified: Secondary | ICD-10-CM

## 2011-05-07 DIAGNOSIS — R7301 Impaired fasting glucose: Secondary | ICD-10-CM

## 2011-05-07 DIAGNOSIS — E785 Hyperlipidemia, unspecified: Secondary | ICD-10-CM

## 2011-05-07 MED ORDER — FLUTICASONE PROPIONATE 50 MCG/ACT NA SUSP: 2.0000 | Freq: Every day | NASAL | Status: DC

## 2011-05-07 NOTE — Progress Notes (Signed)
Subjective:    Patient ID: Daniel Randall, male    DOB: [birth year], 61 y.o.   MRN: [id number]  HPI Patient comes in today for preventive visit and follow-up of medical issues. Update  history since  last visit: No major changes ; ,injury surgery or hospitalizations. HT doing well  LIPDS no se of meds  Allergy flonase helpful No sig exercise right now. No change in fam hx  .    Review of Systems ROS:  GEN/ HEENT: No fever, significant weight changes sweats headaches vision problems hearing changes, CV/ PULM; No chest pain shortness of breath cough, syncope,edema  change in exercise tolerance. GI /GU: No adominal pain, vomiting, change in bowel habits. No blood in the stool. No significant GU symptoms. SKIN/HEME: ,no acute skin rashes suspicious lesions or bleeding. No lymphadenopathy, nodules, masses.  NEURO/ PSYCH:  No neurologic signs such as weakness numbness. excep with right ue at times  From  poss old injury No depression anxiety. IMM/ Allergy: No unusual infections.  Allergy .   Not active  REST of 12 system review negative except as per HPI  Outpatient Prescriptions Prior to Visit  Medication Sig Dispense Refill  . aspirin 81 MG tablet Take 81 mg by mouth daily.        . fluticasone (FLONASE) 50 MCG/ACT nasal spray 2 sprays by Nasal route daily.        Marland Kitchen ketoconazole (NIZORAL) 2 % cream Apply topically daily.        . valsartan-hydrochlorothiazide (DIOVAN HCT) 320-25 MG per tablet 1 tab daily.  90 tablet  1  . VYTORIN 10-40 MG per tablet TAKE 1 TABLET DAILY (NEED TO SCHEDULE A FOLLOW UP APPOINTMENT BEFORE NEXT REFILL)  90 tablet  2  Past history family history social history reviewed in the electronic medical record. Not yet data entered  Or updated from [facility]        Objective:   Physical Exam BP 128/76  Pulse 82  Temp(Src) 98.4 F (36.9 C) (Oral)  Ht 5' 5.5" (1.664 m)  Wt 238 lb (107.956 kg)  BMI 39.00 kg/m2  SpO2 97%  Physical Exam: Vital signs reviewed WUJ:WJXB  is a well-developed well-nourished alert cooperative  White male  who appears   stated age in no acute distress.  HEENT: normocephalic  traumatic , Eyes: PERRL EOM's full, conjunctiva clear, Nares: patent no deformity discharge or tenderness., Ears: no deformity EAC's clear TMs with normal landmarks. Mouth: clear OP, no lesions, edema.  Moist mucous membranes. Dentition in adequate repair. NECK: supple without masses, thyromegaly or bruits. CHEST/PULM:  Clear to auscultation and percussion breath sounds equal no wheeze , rales or rhonchi. No chest wall deformities or tenderness. CV: PMI is nondisplaced, S1 S2 no gallops, murmurs, rubs. Peripheral pulses are full without delay.No JVD .  ABDOMEN: Bowel sounds normal nontender  No guard or rebound, no hepato splenomegal no CVA tenderness.  No hernia. Extremtities:  No clubbing cyanosis or edema, no acute joint swelling or redness no focal atrophy NEURO:  Oriented x3, cranial nerves 3-12 appear to be intact, no obvious focal weakness,gait within normal limits no abnormal reflexes or asymmetrical SKIN: No acute rashes normal turgor, color, no bruising or petechiae. Some sun changes and skin tags . PSYCH: Oriented, good eye contact, no obvious depression anxiety, cognition and judgment appear normal. LN:  No cervical axillary or inguinal adenopathy  Lab Results  Component Value Date   WBC 6.4 04/30/2011   HGB 14.3 04/30/2011  HCT 43.2 04/30/2011   PLT 252.0 04/30/2011   GLUCOSE 98 04/30/2011   CHOL 159 04/30/2011   TRIG 74.0 04/30/2011   HDL 41.90 04/30/2011   LDLCALC 102* 04/30/2011   ALT 23 04/30/2011   AST 25 04/30/2011   NA 141 04/30/2011   K 3.5 04/30/2011   CL 103 04/30/2011   CREATININE 1.1 04/30/2011   BUN 24* 04/30/2011   CO2 27 04/30/2011   TSH 2.54 04/30/2011   PSA 4.64* 04/30/2011   HGBA1C 6.2 05/26/2008          Assessment & Plan:   Preventive Health Care Counseled regarding healthy nutrition, exercise, sleep, injury prevention, calcium vit d and healthy  weight .  HT stable  LIPIDS acceptable IFG stable Hx of elevated PSA  No sx nocturia x 2   Neg fam hx   Get uro consult. Obesity  Counseled. As in the past .  Not  Too motivated at this point. Allergies   Continue flonase

## 2011-05-07 NOTE — Patient Instructions (Signed)
No change in medicines at this time  Checkout my fitness [email or web address] on line for tracking to help with weight loss.   Will contact you about  Urology consult about PSA.  wellnes check in a year or as needed.

## 2011-05-08 ENCOUNTER — Encounter: Payer: Self-pay | Admitting: Internal Medicine

## 2011-05-17 ENCOUNTER — Other Ambulatory Visit: Payer: Self-pay

## 2011-05-17 MED ORDER — FLUTICASONE PROPIONATE 50 MCG/ACT NA SUSP: 2.0000 | Freq: Every day | NASAL | Status: DC

## 2011-05-17 NOTE — Telephone Encounter (Signed)
[facility] for 90 day supply x 1 rf for flonase.

## 2011-09-06 ENCOUNTER — Other Ambulatory Visit: Payer: Self-pay | Admitting: Internal Medicine

## 2011-10-11 ENCOUNTER — Other Ambulatory Visit: Payer: Self-pay | Admitting: Internal Medicine

## 2011-10-16 ENCOUNTER — Encounter (HOSPITAL_COMMUNITY): Payer: Self-pay | Admitting: Pharmacy Technician

## 2011-10-16 ENCOUNTER — Other Ambulatory Visit: Payer: Self-pay | Admitting: Urology

## 2011-10-24 ENCOUNTER — Encounter (HOSPITAL_COMMUNITY)
Admission: RE | Admit: 2011-10-24 | Discharge: 2011-10-24 | Disposition: A | Discharge status: Home / Self Care | Payer: BC Managed Care – PPO | Source: Ambulatory Visit | Attending: Urology | Admitting: Urology

## 2011-10-24 ENCOUNTER — Ambulatory Visit (HOSPITAL_COMMUNITY)
Admission: RE | Admit: 2011-10-24 | Discharge: 2011-10-24 | Disposition: A | Discharge status: Home / Self Care | Payer: BC Managed Care – PPO | Source: Ambulatory Visit | Attending: Urology | Admitting: Urology

## 2011-10-24 ENCOUNTER — Encounter (HOSPITAL_COMMUNITY): Payer: Self-pay

## 2011-10-24 DIAGNOSIS — C61 Malignant neoplasm of prostate: Secondary | ICD-10-CM | POA: Insufficient documentation

## 2011-10-24 DIAGNOSIS — I10 Essential (primary) hypertension: Secondary | ICD-10-CM | POA: Insufficient documentation

## 2011-10-24 DIAGNOSIS — Z01812 Encounter for preprocedural laboratory examination: Secondary | ICD-10-CM | POA: Insufficient documentation

## 2011-10-24 DIAGNOSIS — Z01810 Encounter for preprocedural cardiovascular examination: Secondary | ICD-10-CM | POA: Insufficient documentation

## 2011-10-24 HISTORY — DX: Malignant neoplasm of prostate: C61

## 2011-10-24 HISTORY — PX: WISDOM TOOTH EXTRACTION: SHX21

## 2011-10-24 LAB — SURGICAL PCR SCREEN
MRSA, PCR: NEGATIVE
Staphylococcus aureus: NEGATIVE

## 2011-10-24 LAB — BASIC METABOLIC PANEL
Chloride: 98 mEq/L (ref 96–112)
GFR calc Af Amer: 90 mL/min — ABNORMAL LOW (ref >90)
GFR calc non Af Amer: 77 mL/min — ABNORMAL LOW (ref >90)
Potassium: 3.5 mEq/L (ref 3.5–5.1)
Sodium: 136 mEq/L (ref 135–145)

## 2011-10-24 LAB — CBC
HCT: 40.4 % (ref 39.0–52.0)
Platelets: 241 10*3/uL (ref 150–400)
RDW: 13.5 % (ref 11.5–15.5)
WBC: 7.5 10*3/uL (ref 4.0–10.5)

## 2011-10-24 NOTE — Progress Notes (Signed)
10/24/11 1420  OBSTRUCTIVE SLEEP APNEA  Have you ever been diagnosed with sleep apnea through a sleep study? No  Do you snore loudly (loud enough to be heard through closed doors)?  0  Do you often feel tired, fatigued, or sleepy during the daytime? 0  Has anyone observed you stop breathing during your sleep? 0  Do you have, or are you being treated for high blood pressure? 1  BMI more than 35 kg/m2? 1  Age over 62 years old? 1  Neck circumference greater than 40 cm/18 inches? 0  Gender: 1  Obstructive Sleep Apnea Score 4   Score 4 or greater  Results sent to PCP

## 2011-10-24 NOTE — Patient Instructions (Addendum)
20 QUANTEL DRISKEL  10/24/2011   Your procedure is scheduled on:  10-7 -2013  Report to [facility] at     0515   AM.  Call this number if you have problems the morning of surgery: [phone]  Or Presurgical Testing [phone](Aniela Caniglia)   Remember: Follow any bowel prep instructions per Dr. Belva Crome Office : 1 Fleet enema(adult)   /1 bottle Magnesium Citrate as per instructions   Do not eat food:After Midnight.    Take these medicines the morning of surgery with A SIP OF WATER: Vytorin. Do not take any other AM meds.   Do not wear jewelry, make-up or nail polish.  Do not wear lotions, powders, or perfumes. You may wear deodorant.  Do not shave 48 hours prior to surgery.(face and neck okay, no shaving of legs)  Do not bring valuables to the hospital.  Contacts, dentures or bridgework may not be worn into surgery.  Leave suitcase in the car. After surgery it may be brought to your room.  For patients admitted to the hospital, checkout time is 11:00 AM the day of discharge.   Patients discharged the day of surgery will not be allowed to drive home. Must have responsible person with you x 24 hours once discharged.  Name and phone number of your driver: gavin, [place]- [phone] cell  Special Instructions: CHG Shower Use Special Wash: see special instruction sheet.(avoid face and genitals)   Please read over the following fact sheets that you were given: MRSA Information, Blood Transfusion fact sheet, Incentive Spirometry Instruction.

## 2011-10-25 MED ORDER — MAGNESIUM CITRATE PO SOLN: 1.0000 | Freq: Once | ORAL | Status: DC

## 2011-10-25 MED ORDER — FLEET ENEMA 7-19 GM/118ML RE ENEM: 1.0000 | ENEMA | Freq: Once | RECTAL | Status: DC

## 2011-10-27 NOTE — H&P (Signed)
Problems  1. Benign Prostatic Hypertrophy With Urinary Obstruction 600.01 2. Feelings Of Urinary Urgency 788.63 3. Organic Impotence 607.84 4. Prostate Cancer 185 5. PSA,Elevated 790.93  History of Present Illness  Daniel Randall returns today in f/u from his prostate biopsy done for a PSA of 5.  He had a benign exam.  His prostate volume is 87cc and he has no middle lobe.  He was found to have a T1c Nx Mx Gleason 7 prostate cancer with 10% Gleason 7 in the left apical lateral core.  There was low volume Gleason 6 in 2 additional cores on that side.   A Kattan nomogram predicts a 79% chance of organ confined disease, 15% chance of ECE,  2% chance of SVI and 2.3% chance of LNI.  He has a SHIM of 5 and is not concerned about erectile function.  His IPSS is 22 and a flowrate today had a PF of 13cc/sec.   Past Medical History Problems  1. History of  Hypercholesterolemia 272.0 2. History of  Hypertension 401.9 3. History of  Nephrolithiasis V13.01  Surgical History Problems  1. History of  Shoulder Surgery Left  Current Meds 1. Aspirin 81 MG Oral Tablet; Therapy: (Recorded:22May2013) to 2. Fluticasone Propionate 50 MCG/ACT Nasal Suspension; Therapy: 26Apr2013 to 3. Valsartan-Hydrochlorothiazide 320-12.5 MG Oral Tablet; Therapy: (Recorded:22May2013) to 4. Vytorin 10-40 MG Oral Tablet; Therapy: (Recorded:22May2013) to  Allergies Medication  1. No Known Drug Allergies  Family History Problems  1. Family history of  Death In The Family Father age 88-colon cancer 2. Family history of  Death In The Family Mother age 88 stroke 3. Maternal history of  Diabetes Mellitus V18.0 4. Sororal history of  Diabetes Mellitus V18.0 5. Family history of  Family Health Status Number Of Children 1 son, 2 daughters 6. Fraternal history of  Prostate Cancer V16.42  Social History Problems  1. Alcohol Use 1 glass of wine every 3 months 2. Caffeine Use 5 cups daily-mostly cofee 3. Marital History -  Currently Married 4. Never A Smoker 5. Occupation: Airline pilot Denied  6. History of  Tobacco Use 305.1  Results/Data  The following images/tracing/specimen were independently visualized:  Kattan nomogram calculated and noted above.  The following clinical lab reports were reviewed:  I have reviewed his path report.  Flow Rate: Voided 164 ml. A peak flow rate of 50ml/s, mean flow rate of 57ml/s and normal but slightly reduced. flow curve .  IPSS: The IPSS today is 22   SHIM: The SHIM score today is 5 .    Assessment Assessed  1. Prostate Cancer 185 2. Benign Prostatic Hypertrophy With Urinary Obstruction 600.01   He has a T1c Nx Mx Gleason 7 prostate cancer with preexisting ED and moderate to severe LUTS with an 87cc prostate. He is not a traditional candidate for active surveillance and his prostate is too large for seeds or cryo without downstaging and I am concerned about his LUTS as far as radiation therapy is concerned although he is a candidate for EXRT, but if he chose that I would want to aggressively treat his LUT's.   He is a good candidate for RALP which would give him an excellent chance of cancer cure with relief of his LUTS.   Plan Benign Prostatic Hypertrophy With Urinary Obstruction (600.01)  1. Complex Uroflowmetry  Done: 09Aug2013 Prostate Cancer (185)  2. Follow-up Schedule Surgery Office  Follow-up  Done: 09Aug2013 3. PT Referral Referral  Referral  Requested for: 04Sep2013   After  a discussion of the risks and benefits of the various treatment options, he would like to be scheduled for surgery.   Please see the discussion area for the explanation of risks.

## 2011-10-28 ENCOUNTER — Encounter (HOSPITAL_COMMUNITY): Payer: Self-pay | Admitting: *Deleted

## 2011-10-28 ENCOUNTER — Encounter (HOSPITAL_COMMUNITY): Payer: Self-pay | Admitting: Anesthesiology

## 2011-10-28 ENCOUNTER — Ambulatory Visit (HOSPITAL_COMMUNITY): Payer: BC Managed Care – PPO | Admitting: Anesthesiology

## 2011-10-28 ENCOUNTER — Observation Stay (HOSPITAL_COMMUNITY)
Admission: RE | Admit: 2011-10-28 | Discharge: 2011-10-31 | Disposition: A | Discharge status: Home / Self Care | Payer: BC Managed Care – PPO | Source: Ambulatory Visit | Attending: Urology | Admitting: Urology

## 2011-10-28 ENCOUNTER — Encounter (HOSPITAL_COMMUNITY)
Admission: RE | Disposition: A | Discharge status: Home / Self Care | Payer: Self-pay | Source: Ambulatory Visit | Attending: Urology

## 2011-10-28 DIAGNOSIS — E7800 Pure hypercholesterolemia, unspecified: Secondary | ICD-10-CM | POA: Insufficient documentation

## 2011-10-28 DIAGNOSIS — I10 Essential (primary) hypertension: Secondary | ICD-10-CM | POA: Insufficient documentation

## 2011-10-28 DIAGNOSIS — Z23 Encounter for immunization: Secondary | ICD-10-CM | POA: Insufficient documentation

## 2011-10-28 DIAGNOSIS — Z7982 Long term (current) use of aspirin: Secondary | ICD-10-CM | POA: Insufficient documentation

## 2011-10-28 DIAGNOSIS — N529 Male erectile dysfunction, unspecified: Secondary | ICD-10-CM | POA: Insufficient documentation

## 2011-10-28 DIAGNOSIS — N401 Enlarged prostate with lower urinary tract symptoms: Secondary | ICD-10-CM | POA: Insufficient documentation

## 2011-10-28 DIAGNOSIS — R972 Elevated prostate specific antigen [PSA]: Secondary | ICD-10-CM | POA: Insufficient documentation

## 2011-10-28 DIAGNOSIS — Z79899 Other long term (current) drug therapy: Secondary | ICD-10-CM | POA: Insufficient documentation

## 2011-10-28 DIAGNOSIS — C61 Malignant neoplasm of prostate: Principal | ICD-10-CM | POA: Insufficient documentation

## 2011-10-28 DIAGNOSIS — N138 Other obstructive and reflux uropathy: Secondary | ICD-10-CM | POA: Insufficient documentation

## 2011-10-28 DIAGNOSIS — R3915 Urgency of urination: Secondary | ICD-10-CM | POA: Insufficient documentation

## 2011-10-28 HISTORY — DX: Malignant neoplasm of prostate: C61

## 2011-10-28 HISTORY — PX: ROBOT ASSISTED LAPAROSCOPIC RADICAL PROSTATECTOMY: SHX5141

## 2011-10-28 LAB — ABO/RH: ABO/RH(D): O POS

## 2011-10-28 LAB — TYPE AND SCREEN
ABO/RH(D): O POS
Antibody Screen: NEGATIVE

## 2011-10-28 LAB — HEMOGLOBIN AND HEMATOCRIT, BLOOD: Hemoglobin: 12.3 g/dL — ABNORMAL LOW (ref 13.0–17.0)

## 2011-10-28 SURGERY — ROBOTIC ASSISTED LAPAROSCOPIC RADICAL PROSTATECTOMY
Anesthesia: General | Wound class: Clean Contaminated

## 2011-10-28 MED ORDER — IRBESARTAN 300 MG PO TABS
300.0000 mg | ORAL_TABLET | Freq: Every day | ORAL | Status: DC
  Administered 2011-10-28 – 2011-10-31 (×4): 300 mg via ORAL
  Filled 2011-10-28 (×4): qty 1

## 2011-10-28 MED ORDER — FENTANYL CITRATE 0.05 MG/ML IJ SOLN
INTRAMUSCULAR | Status: DC | PRN
  Administered 2011-10-28 (×2): 50 ug via INTRAVENOUS
  Administered 2011-10-28: 100 ug via INTRAVENOUS
  Administered 2011-10-28: 50 ug via INTRAVENOUS

## 2011-10-28 MED ORDER — EZETIMIBE-SIMVASTATIN 10-40 MG PO TABS
1.0000 | ORAL_TABLET | Freq: Every day | ORAL | Status: DC
  Administered 2011-10-29 – 2011-10-31 (×3): 1 via ORAL
  Filled 2011-10-28 (×3): qty 1

## 2011-10-28 MED ORDER — PNEUMOCOCCAL VAC POLYVALENT 25 MCG/0.5ML IJ INJ
0.5000 mL | INJECTION | INTRAMUSCULAR | Status: AC
  Administered 2011-10-29: 0.5 mL via INTRAMUSCULAR
  Filled 2011-10-28: qty 0.5

## 2011-10-28 MED ORDER — INFLUENZA VIRUS VACC SPLIT PF IM SUSP
0.5000 mL | INTRAMUSCULAR | Status: AC
  Administered 2011-10-29: 0.5 mL via INTRAMUSCULAR
  Filled 2011-10-28: qty 0.5

## 2011-10-28 MED ORDER — GLYCOPYRROLATE 0.2 MG/ML IJ SOLN
INTRAMUSCULAR | Status: DC | PRN
  Administered 2011-10-28: .8 mg via INTRAVENOUS

## 2011-10-28 MED ORDER — CEFAZOLIN SODIUM-DEXTROSE 2-3 GM-% IV SOLR
2.0000 g | INTRAVENOUS | Status: DC
  Administered 2011-10-28: 2 g via INTRAVENOUS

## 2011-10-28 MED ORDER — ACETAMINOPHEN 10 MG/ML IV SOLN
1000.0000 mg | Freq: Four times a day (QID) | INTRAVENOUS | Status: AC
  Administered 2011-10-28 – 2011-10-29 (×4): 1000 mg via INTRAVENOUS
  Filled 2011-10-28 (×4): qty 100

## 2011-10-28 MED ORDER — MEPERIDINE HCL 50 MG/ML IJ SOLN: 6.2500 mg | INTRAMUSCULAR | Status: DC | PRN

## 2011-10-28 MED ORDER — VALSARTAN-HYDROCHLOROTHIAZIDE 320-25 MG PO TABS: 1.0000 | ORAL_TABLET | Freq: Every day | ORAL | Status: DC

## 2011-10-28 MED ORDER — BUPIVACAINE-EPINEPHRINE PF 0.25-1:200000 % IJ SOLN
INTRAMUSCULAR | Status: AC
  Filled 2011-10-28: qty 30

## 2011-10-28 MED ORDER — PHENYLEPHRINE HCL 10 MG/ML IJ SOLN
INTRAMUSCULAR | Status: DC | PRN
  Administered 2011-10-28: 100 ug via INTRAVENOUS

## 2011-10-28 MED ORDER — NEOSTIGMINE METHYLSULFATE 1 MG/ML IJ SOLN
INTRAMUSCULAR | Status: DC | PRN
  Administered 2011-10-28: 5 mg via INTRAVENOUS

## 2011-10-28 MED ORDER — SODIUM CHLORIDE 0.9 % IV BOLUS (SEPSIS)
500.0000 mL | Freq: Once | INTRAVENOUS | Status: AC
  Administered 2011-10-28: 500 mL via INTRAVENOUS

## 2011-10-28 MED ORDER — PROPOFOL 10 MG/ML IV BOLUS
INTRAVENOUS | Status: DC | PRN
  Administered 2011-10-28: 200 mg via INTRAVENOUS

## 2011-10-28 MED ORDER — CIPROFLOXACIN HCL 500 MG PO TABS: 500.0000 mg | ORAL_TABLET | Freq: Two times a day (BID) | ORAL | Status: DC

## 2011-10-28 MED ORDER — CEFAZOLIN SODIUM-DEXTROSE 2-3 GM-% IV SOLR
INTRAVENOUS | Status: AC
  Filled 2011-10-28: qty 50

## 2011-10-28 MED ORDER — INDIGOTINDISULFONATE SODIUM 8 MG/ML IJ SOLN
INTRAMUSCULAR | Status: DC | PRN
  Administered 2011-10-28: 40 mg via INTRAVENOUS

## 2011-10-28 MED ORDER — HYDROCODONE-ACETAMINOPHEN 5-325 MG PO TABS
1.0000 | ORAL_TABLET | ORAL | Status: DC | PRN
  Administered 2011-10-29 – 2011-10-30 (×3): 2 via ORAL
  Administered 2011-10-30: 1 via ORAL
  Administered 2011-10-31: 2 via ORAL
  Filled 2011-10-28 (×6): qty 2

## 2011-10-28 MED ORDER — LACTATED RINGERS IV SOLN: INTRAVENOUS | Status: DC

## 2011-10-28 MED ORDER — BUPIVACAINE-EPINEPHRINE 0.25% -1:200000 IJ SOLN
INTRAMUSCULAR | Status: DC | PRN
  Administered 2011-10-28: 30 mL

## 2011-10-28 MED ORDER — EPHEDRINE SULFATE 50 MG/ML IJ SOLN
INTRAMUSCULAR | Status: DC | PRN
  Administered 2011-10-28 (×3): 5 mg via INTRAVENOUS
  Administered 2011-10-28: 10 mg via INTRAVENOUS
  Administered 2011-10-28 (×2): 5 mg via INTRAVENOUS

## 2011-10-28 MED ORDER — FLUTICASONE PROPIONATE 50 MCG/ACT NA SUSP
2.0000 | Freq: Every day | NASAL | Status: DC
  Administered 2011-10-28 – 2011-10-31 (×3): 2 via NASAL
  Filled 2011-10-28: qty 16

## 2011-10-28 MED ORDER — ZOLPIDEM TARTRATE 5 MG PO TABS
5.0000 mg | ORAL_TABLET | Freq: Every evening | ORAL | Status: DC | PRN
  Administered 2011-10-29: 5 mg via ORAL
  Filled 2011-10-28: qty 1

## 2011-10-28 MED ORDER — LACTATED RINGERS IV SOLN
INTRAVENOUS | Status: DC | PRN
  Administered 2011-10-28: 08:00:00

## 2011-10-28 MED ORDER — BELLADONNA ALKALOIDS-OPIUM 16.2-60 MG RE SUPP
1.0000 | Freq: Four times a day (QID) | RECTAL | Status: DC | PRN
  Administered 2011-10-28: 1 via RECTAL
  Filled 2011-10-28: qty 1

## 2011-10-28 MED ORDER — HEPARIN SODIUM (PORCINE) 1000 UNIT/ML IJ SOLN
INTRAMUSCULAR | Status: AC
  Filled 2011-10-28: qty 1

## 2011-10-28 MED ORDER — HYDROMORPHONE HCL PF 1 MG/ML IJ SOLN
1.0000 mg | INTRAMUSCULAR | Status: DC | PRN
  Administered 2011-10-28 – 2011-10-29 (×3): 1 mg via INTRAVENOUS
  Filled 2011-10-28 (×3): qty 1

## 2011-10-28 MED ORDER — HYDROCODONE-ACETAMINOPHEN 5-325 MG PO TABS: 1.0000 | ORAL_TABLET | Freq: Four times a day (QID) | ORAL | Status: DC | PRN

## 2011-10-28 MED ORDER — PROMETHAZINE HCL 25 MG/ML IJ SOLN: 6.2500 mg | INTRAMUSCULAR | Status: DC | PRN

## 2011-10-28 MED ORDER — KCL IN DEXTROSE-NACL 10-5-0.45 MEQ/L-%-% IV SOLN
INTRAVENOUS | Status: DC
  Administered 2011-10-28 – 2011-10-29 (×2): 125 mL/h via INTRAVENOUS
  Administered 2011-10-29: 08:00:00 via INTRAVENOUS
  Filled 2011-10-28 (×5): qty 1000

## 2011-10-28 MED ORDER — HYDROMORPHONE HCL PF 1 MG/ML IJ SOLN: 0.2500 mg | INTRAMUSCULAR | Status: DC | PRN

## 2011-10-28 MED ORDER — MIDAZOLAM HCL 5 MG/5ML IJ SOLN
INTRAMUSCULAR | Status: DC | PRN
  Administered 2011-10-28: 2 mg via INTRAVENOUS

## 2011-10-28 MED ORDER — HYDROMORPHONE HCL PF 1 MG/ML IJ SOLN
INTRAMUSCULAR | Status: DC | PRN
  Administered 2011-10-28 (×4): 0.5 mg via INTRAVENOUS

## 2011-10-28 MED ORDER — ONDANSETRON HCL 4 MG/2ML IJ SOLN
INTRAMUSCULAR | Status: DC | PRN
  Administered 2011-10-28: 4 mg via INTRAVENOUS

## 2011-10-28 MED ORDER — HYDROCHLOROTHIAZIDE 25 MG PO TABS
25.0000 mg | ORAL_TABLET | Freq: Every day | ORAL | Status: DC
  Administered 2011-10-28 – 2011-10-31 (×4): 25 mg via ORAL
  Filled 2011-10-28 (×4): qty 1

## 2011-10-28 MED ORDER — INDIGOTINDISULFONATE SODIUM 8 MG/ML IJ SOLN
INTRAMUSCULAR | Status: AC
  Filled 2011-10-28: qty 10

## 2011-10-28 MED ORDER — LACTATED RINGERS IV SOLN
INTRAVENOUS | Status: DC | PRN
  Administered 2011-10-28 (×3): via INTRAVENOUS

## 2011-10-28 MED ORDER — MORPHINE SULFATE 2 MG/ML IJ SOLN
2.0000 mg | INTRAMUSCULAR | Status: DC | PRN
  Administered 2011-10-28 (×2): 4 mg via INTRAVENOUS
  Filled 2011-10-28 (×2): qty 2

## 2011-10-28 MED ORDER — SODIUM CHLORIDE 0.9 % IV BOLUS (SEPSIS)
1000.0000 mL | Freq: Once | INTRAVENOUS | Status: AC
  Administered 2011-10-28: 1000 mL via INTRAVENOUS

## 2011-10-28 MED ORDER — ROCURONIUM BROMIDE 100 MG/10ML IV SOLN
INTRAVENOUS | Status: DC | PRN
  Administered 2011-10-28: 50 mg via INTRAVENOUS
  Administered 2011-10-28: 10 mg via INTRAVENOUS
  Administered 2011-10-28: 5 mg via INTRAVENOUS

## 2011-10-28 MED ORDER — LIDOCAINE HCL (CARDIAC) 20 MG/ML IV SOLN
INTRAVENOUS | Status: DC | PRN
  Administered 2011-10-28: 60 mg via INTRAVENOUS

## 2011-10-28 MED ORDER — SODIUM CHLORIDE 0.9 % IR SOLN
Status: DC | PRN
  Administered 2011-10-28: 1000 mL via INTRAVESICAL

## 2011-10-28 SURGICAL SUPPLY — 46 items
APPLICATOR SURGIFLO ENDO (HEMOSTASIS) ×3 IMPLANT
CANISTER SUCTION 2500CC (MISCELLANEOUS) ×3 IMPLANT
CATH ROBINSON RED A/P 8FR (CATHETERS) ×3 IMPLANT
CHLORAPREP W/TINT 26ML (MISCELLANEOUS) ×3 IMPLANT
CLOTH BEACON ORANGE TIMEOUT ST (SAFETY) ×3 IMPLANT
CORD HIGH FREQUENCY UNIPOLAR (ELECTROSURGICAL) ×3 IMPLANT
COVER SURGICAL LIGHT HANDLE (MISCELLANEOUS) ×3 IMPLANT
COVER TIP SHEARS 8 DVNC (MISCELLANEOUS) ×2 IMPLANT
COVER TIP SHEARS 8MM DA VINCI (MISCELLANEOUS) ×1
CUTTER ECHEON FLEX ENDO 45 340 (ENDOMECHANICALS) ×3 IMPLANT
DECANTER SPIKE VIAL GLASS SM (MISCELLANEOUS) IMPLANT
DRAPE SURG IRRIG POUCH 19X23 (DRAPES) ×3 IMPLANT
DRSG TEGADERM 2-3/8X2-3/4 SM (GAUZE/BANDAGES/DRESSINGS) ×15 IMPLANT
DRSG TEGADERM 4X4.75 (GAUZE/BANDAGES/DRESSINGS) ×3 IMPLANT
DRSG TEGADERM 6X8 (GAUZE/BANDAGES/DRESSINGS) ×6 IMPLANT
ELECT REM PT RETURN 9FT ADLT (ELECTROSURGICAL) ×3
ELECTRODE REM PT RTRN 9FT ADLT (ELECTROSURGICAL) ×2 IMPLANT
FLOSEAL 10ML (HEMOSTASIS) ×3 IMPLANT
GLOVE BIO SURGEON STRL SZ 6.5 (GLOVE) ×3 IMPLANT
GLOVE SURG SS PI 8.0 STRL IVOR (GLOVE) ×6 IMPLANT
GOWN STRL NON-REIN LRG LVL3 (GOWN DISPOSABLE) ×9 IMPLANT
GOWN STRL REIN XL XLG (GOWN DISPOSABLE) ×3 IMPLANT
HOLDER FOLEY CATH W/STRAP (MISCELLANEOUS) ×3 IMPLANT
IV LACTATED RINGERS 1000ML (IV SOLUTION) ×3 IMPLANT
KIT ACCESSORY DA VINCI DISP (KITS) ×1
KIT ACCESSORY DVNC DISP (KITS) ×2 IMPLANT
NDL SAFETY ECLIPSE 18X1.5 (NEEDLE) ×2 IMPLANT
NEEDLE HYPO 18GX1.5 SHARP (NEEDLE) ×1
PACK ROBOT UROLOGY CUSTOM (CUSTOM PROCEDURE TRAY) ×3 IMPLANT
RELOAD GREEN ECHELON 45 (STAPLE) ×3 IMPLANT
SEALER TISSUE G2 CVD JAW 45CM (ENDOMECHANICALS) ×3 IMPLANT
SET TUBE IRRIG SUCTION NO TIP (IRRIGATION / IRRIGATOR) ×3 IMPLANT
SOLUTION ELECTROLUBE (MISCELLANEOUS) ×3 IMPLANT
SPONGE GAUZE 4X4 12PLY (GAUZE/BANDAGES/DRESSINGS) IMPLANT
SURGIFLO W/THROMBIN 8M KIT (HEMOSTASIS) ×3 IMPLANT
SUT MNCRL 3 0 RB1 (SUTURE) ×2 IMPLANT
SUT MNCRL 3 0 VIOLET RB1 (SUTURE) ×2 IMPLANT
SUT MONOCRYL 3 0 RB1 (SUTURE) ×2
SUT VIC AB 3-0 SH 27 (SUTURE) ×1
SUT VIC AB 3-0 SH 27X BRD (SUTURE) ×2 IMPLANT
SUT VIC AB 4-0 RB1 27 (SUTURE) ×1
SUT VIC AB 4-0 RB1 27XBRD (SUTURE) ×2 IMPLANT
SUT VICRYL 0 UR6 27IN ABS (SUTURE) ×3 IMPLANT
SYR 27GX1/2 1ML LL SAFETY (SYRINGE) ×3 IMPLANT
TOWEL OR NON WOVEN STRL DISP B (DISPOSABLE) ×3 IMPLANT
WATER STERILE IRR 1500ML POUR (IV SOLUTION) ×6 IMPLANT

## 2011-10-28 NOTE — Anesthesia Postprocedure Evaluation (Signed)
  Anesthesia Post-op Note  Patient: Daniel Randall  Procedure(s) Performed: Procedure(s) (LRB): ROBOTIC ASSISTED LAPAROSCOPIC RADICAL PROSTATECTOMY (N/A) LAPAROSCOPIC LYSIS OF ADHESIONS ()  Patient Location: PACU  Anesthesia Type: General  Level of Consciousness: awake and alert   Airway and Oxygen Therapy: Patient Spontanous Breathing  Post-op Pain: mild  Post-op Assessment: Post-op Vital signs reviewed, Patient's Cardiovascular Status Stable, Respiratory Function Stable, Patent Airway and No signs of Nausea or vomiting  Post-op Vital Signs: stable  Complications: No apparent anesthesia complications

## 2011-10-28 NOTE — Anesthesia Procedure Notes (Signed)
Performed by: Delayna Sparlin M       

## 2011-10-28 NOTE — Progress Notes (Signed)
Patient ID: Daniel Randall, male   DOB: 06/20/1950, 61 y.o.   MRN: [id number]  He is doing well post op but did get dizzy with mild hypotension with an attempt at walking.   His BP is a bit low.  His Hgb was 12.3.  I am going to give him a 500cc NS bolus.

## 2011-10-28 NOTE — Progress Notes (Signed)
Call to urology as pt calling out in uncontrollable pain, states 10/10. He can not describe pain. Foley is draining pink urine. JP emptied 110 cc serous fluid. Abd tender to touch all over. Morphine 4mg  adm. See orders from Dr. Margarita Grizzle. Will observe closely.

## 2011-10-28 NOTE — Interval H&P Note (Signed)
History and Physical Interval Note:  10/28/2011 7:00 AM  Daniel Randall  has presented today for surgery, with the diagnosis of prostate cancer  The various methods of treatment have been discussed with the patient and family. After consideration of risks, benefits and other options for treatment, the patient has consented to  Procedure(s) (LRB) with comments: ROBOTIC ASSISTED LAPAROSCOPIC RADICAL PROSTATECTOMY (N/A) as a surgical intervention .  The patient's history has been reviewed, patient examined, no change in status, stable for surgery.  I have reviewed the patient's chart and labs.  Questions were answered to the patient's satisfaction.     Jameis Newsham J

## 2011-10-28 NOTE — Progress Notes (Signed)
Pt c/o pain 10/10 again,states sharp urge to urinate, then intense pain, medication adm. Encourage to deep breath, urine flowing via Foley, pink in color. Abd tender, unchanged.

## 2011-10-28 NOTE — Anesthesia Preprocedure Evaluation (Addendum)
Anesthesia Evaluation  Patient identified by MRN, date of birth, ID band Patient awake    Reviewed: Allergy & Precautions, H&P , NPO status , Patient's Chart, lab work & pertinent test results  Airway Mallampati: II TM Distance: >3 FB Neck ROM: Full    Dental No notable dental hx.    Pulmonary neg pulmonary ROS,  breath sounds clear to auscultation  Pulmonary exam normal       Cardiovascular hypertension, Pt. on medications negative cardio ROS  Rhythm:Regular Rate:Normal     Neuro/Psych negative neurological ROS  negative psych ROS   GI/Hepatic negative GI ROS, Neg liver ROS,   Endo/Other  negative endocrine ROSMorbid obesity  Renal/GU negative Renal ROS  negative genitourinary   Musculoskeletal negative musculoskeletal ROS (+)   Abdominal   Peds negative pediatric ROS (+)  Hematology negative hematology ROS (+)   Anesthesia Other Findings   Reproductive/Obstetrics negative OB ROS                          Anesthesia Physical Anesthesia Plan  ASA: III  Anesthesia Plan: General   Post-op Pain Management:    Induction: Intravenous  Airway Management Planned: Oral ETT  Additional Equipment:   Intra-op Plan:   Post-operative Plan: Extubation in OR  Informed Consent: I have reviewed the patients History and Physical, chart, labs and discussed the procedure including the risks, benefits and alternatives for the proposed anesthesia with the patient or authorized representative who has indicated his/her understanding and acceptance.   Dental advisory given  Plan Discussed with: CRNA  Anesthesia Plan Comments:         Anesthesia Quick Evaluation  

## 2011-10-28 NOTE — Progress Notes (Signed)
Patient ambulate about 48ft, Tech called Rn,pt complained of light headedness, pale, pt wheeled to room, placed on bed BP 96/57, HR 78, O2 sats on room air 93%.. MD seen patient.

## 2011-10-28 NOTE — Brief Op Note (Signed)
10/28/2011  11:07 AM  PATIENT:  Daniel Randall  61 y.o. male  PRE-OPERATIVE DIAGNOSIS:  T1c Nx Mx Gleason 7 prostate cancer  POST-OPERATIVE DIAGNOSIS: Same  PROCEDURE:  Procedure(s) (LRB) with comments: ROBOTIC ASSISTED LAPAROSCOPIC RADICAL PROSTATECTOMY (N/A) LAPAROSCOPIC LYSIS OF ADHESIONS ()  SURGEON:  Surgeon(s) and Role:    * Anner Crete, MD - Primary  PHYSICIAN ASSISTANT:   ASSISTANTS: none   ANESTHESIA:   general  EBL:  Total I/O In: 1000 [I.V.:1000] Out: 700 [Blood:700]  BLOOD ADMINISTERED:none  DRAINS: (19) Jackson-Pratt drain(s) with closed bulb suction in the LLQ and Urinary Catheter (Foley)   LOCAL MEDICATIONS USED:  MARCAINE     SPECIMEN:  Source of Specimen:  Prostate with SV's and bladder neck margin.   DISPOSITION OF SPECIMEN:  PATHOLOGY  COUNTS:  YES  TOURNIQUET:  * No tourniquets in log *  DICTATION: .Other Dictation: Dictation Number [id number]  PLAN OF CARE: Admit to inpatient   PATIENT DISPOSITION:  PACU - hemodynamically stable.   Delay start of Pharmacological VTE agent (>24hrs) due to surgical blood loss or risk of bleeding: yes

## 2011-10-28 NOTE — Transfer of Care (Signed)
Immediate Anesthesia Transfer of Care Note  Patient: Daniel Randall  Procedure(s) Performed: Procedure(s) (LRB): ROBOTIC ASSISTED LAPAROSCOPIC RADICAL PROSTATECTOMY (N/A) LAPAROSCOPIC LYSIS OF ADHESIONS ()  Patient Location: PACU  Anesthesia Type: General  Level of Consciousness: sedated, patient cooperative and responds to stimulaton  Airway & Oxygen Therapy: Patient Spontanous Breathing and Patient connected to face mask oxgen  Post-op Assessment: Report given to PACU RN and Post -op Vital signs reviewed and stable  Post vital signs: Reviewed and stable  Complications: No apparent anesthesia complications

## 2011-10-28 NOTE — Transfer of Care (Signed)
Immediate Anesthesia Transfer of Care Note  Patient: Daniel Randall  Procedure(s) Performed: Procedure(s) (LRB) with comments: ROBOTIC ASSISTED LAPAROSCOPIC RADICAL PROSTATECTOMY (N/A) LAPAROSCOPIC LYSIS OF ADHESIONS ()  Patient Location: PACU  Anesthesia Type: Regional  Level of Consciousness: awake and alert   Airway & Oxygen Therapy: Patient Spontanous Breathing and Patient connected to face mask oxygen  Post-op Assessment: Report given to PACU RN and Post -op Vital signs reviewed and stable  Post vital signs: Reviewed and stable  Complications: No apparent anesthesia complications

## 2011-10-29 ENCOUNTER — Encounter (HOSPITAL_COMMUNITY): Payer: Self-pay | Admitting: Urology

## 2011-10-29 LAB — HEMOGLOBIN AND HEMATOCRIT, BLOOD
HCT: 33.3 % — ABNORMAL LOW (ref 39.0–52.0)
Hemoglobin: 11.4 g/dL — ABNORMAL LOW (ref 13.0–17.0)

## 2011-10-29 LAB — BASIC METABOLIC PANEL
BUN: 11 mg/dL (ref 6–23)
Calcium: 8 mg/dL — ABNORMAL LOW (ref 8.4–10.5)
GFR calc non Af Amer: 68 mL/min — ABNORMAL LOW (ref >90)
Glucose, Bld: 165 mg/dL — ABNORMAL HIGH (ref 70–99)

## 2011-10-29 MED ORDER — OXYBUTYNIN CHLORIDE 5 MG PO TABS
5.0000 mg | ORAL_TABLET | Freq: Three times a day (TID) | ORAL | Status: DC | PRN
  Filled 2011-10-29: qty 1

## 2011-10-29 MED ORDER — HYDROMORPHONE HCL PF 1 MG/ML IJ SOLN
1.0000 mg | INTRAMUSCULAR | Status: DC | PRN
  Administered 2011-10-29 – 2011-10-30 (×3): 1 mg via INTRAVENOUS
  Filled 2011-10-29 (×2): qty 1

## 2011-10-29 MED ORDER — ACETAMINOPHEN 10 MG/ML IV SOLN
1000.0000 mg | Freq: Four times a day (QID) | INTRAVENOUS | Status: AC
  Administered 2011-10-30 (×3): 1000 mg via INTRAVENOUS
  Filled 2011-10-29 (×4): qty 100

## 2011-10-29 NOTE — Op Note (Signed)
NAMEDERWIN, Daniel Randall                ACCOUNT NO.:  [id number]  MEDICAL RECORD NO.:  [id number]  LOCATION:  1431                         FACILITY:  [facility]  PHYSICIAN:  [name]. [name], [name]    DATE OF BIRTH:  05/07/1950  DATE OF PROCEDURE:  10/28/2011 DATE OF DISCHARGE:                              OPERATIVE REPORT   PREOPERATIVE DIAGNOSIS:  T1c Nx Mx Gleason 7 adenocarcinoma of the prostate.  POSTOPERATIVE DIAGNOSIS:  T1c Nx Mx Gleason 7 adenocarcinoma of the prostate.  PROCEDURE:  Robotic-assisted laparoscopic radical prostatectomy.  SURGEON:  [name]. [name], MD  ASSISTANT:  Pecola Leisure, PA  ANESTHESIA:  General.  DRAINS:  A 20-French Foley catheter and 19-French Blake drain.  SPECIMEN:  Prostate and seminal vesicles with bladder neck margin.  ESTIMATED BLOOD LOSS:  700 mL.  COMPLICATIONS:  None.  INDICATIONS:  Mr. Daniel Randall is a 61 year old white male who was recently evaluated for an elevated PSA and was found to have multifocal prostate cancer, primarily Gleason 6 with one focus of Gleason 7 disease.  He also had significant obstructive voiding symptoms.  It was felt that after reviewing his options that the robotic prostatectomy will be most appropriate.  FINDINGS AND PROCEDURE:  He had undergone preoperative physical therapy training and the hospital class.  He was given the routine bowel prep. He was taken to the operating room where he was given 2 g of Ancef. General anesthetic was induced and he was placed on the table in the low lithotomy position.  A red rubber rectal catheter was placed and secured with tape to his left thigh and the bulb suction was secured to the catheter.  He was fitted with a PAS hose.  His abdomen was clipped.  He was then prepped with ChloraPrep on the abdomen and Betadine on the genitals.  He was then placed in a steep Trendelenburg position with the Mayo over the head and draped in the usual sterile fashion.  A 22-French Foley  catheter was then inserted.  The balloon was filled with 30 mL of sterile fluid and the bladder was drained.  The catheter was connected to an irrigation syringe.  At this point, the video port site was marked 18 cm superior to the pubis.  This ended up being just below the umbilicus in the midline. [name] was made on the operative site and a 2-cm incision was then made in the midline.  A subcutaneous tissues were spread to the fascia and [employer] and appendiceal retractors were used to expose the anterior rectus fascia, and the anterior rectus fascia was opened using cautery.  The rectus muscles were parted in the midline.  The posterior sheath was nicked with a knife and penetrated with a hemostat and the peritoneal cavity was entered.  An S retractor was used to aid entry into the peritoneal cavity.  A 12-mm laparoscopic port was then placed and the skin was secured around the port with 0 Vicryl suture in a figure-of-eight fashion.  The abdomen was then insufflated initially at slow flow and then good insufflation was noted with high flow.  At this point, inspection of the abdomen with laparoscope revealed moderate midline adhesions.  The 2 left robot ports were then marked and infiltrated with local anesthetic, and incisions were made.  The subcutaneous tissues were spread with a hemostat and two 8-mm ports were placed in the standard configuration.  With these ports in place, a [place] and laparoscopic scissors were then used to take down the adhesions which were primarily to the left obliterated umbilical artery area.  The bowel was tented, but not in approximation with the anterior abdominal wall.  Once the adhesions had been dropped away and no active bleeding was noted, the remaining port sites were marked, infiltrated, and created on the left with a 5-mm robot port medially, a 12-mm system port laterally and a 5-mm assistant port superior medially.  Once all ports were  in position, the robot which had been draped earlier was positioned and brought to the field and docked.  The left lateral arm was fitted with a ProGrasp, the left medial arm with a PK dissector, and the right arm with a cautery scissor.  At this point, the bladder was filled with approximately 200 mL of fluid to aid visualization and the anterior peritoneum was incised transversely with concomitant division of the obliterated umbilical arteries.  Once the perineum had been incised, the bladder was dropped away from the anterior abdominal wall until the pubis was identified. The bladder was then drained and the retropubic space was further dissected down to expose the anterior prostate.  The anterior prostate was then defatted and the right in the pelvic fascia was incised and the lateral resection was performed to free the prostate from the pelvic sidewall.  This was then repeated on the left.  Once the endopelvic dissection had been performed, the puboprostatic ligaments were incised at their attachment to the pubis with cautery scissors and the dorsal vein complex was dissected out to the pelvic floor.  At this point, an Endo-GIA was placed and the dorsal vein complex was divided.  I then asked for tension on the Foley to identify the bladder neck. Once this was identified, the anterior bladder neck was incised transversely with cautery scissors and the dissection was carried down until the Foley catheter was exposed.  During this dissection, there was concern about penetration into the prostate near the urethra, so additional tissue was taken for margins in this area.  Once the Foley catheter was identified, it was brought out through the vesicotomy and used to provide anterior traction on the prostate.  The posterior bladder neck was then divided and dissection was carried down until the ampulla of vas were identified.  The right ampulla was identified initially, this was  dissected proximally and divided and used to provide anterior traction.  The left ampulla was then dissected out. The seminal vesicles were then dissected out, they required rudimentary and did not extend far from the base of the prostate gland.  At this point, the posterior sheath of Denonvilliers fascia was incised and the posterior prostatic plane was developed using the ProGrasp and the scissors as needed.  At this point, the anterior traction was released and the prostate was reflected to the left.  The right neurovascular dissection was then performed by incising the periprosthetic fascia and developing an intrafascial plane adjacent to the prostate.  The dissection was carried out from apex to the base.  There was some venous bleeding during this particular maneuver.  Once the neurovascular bundle dissection was performed, the prostatic pedicle was taken down using the EnSeal device, and then the residual lateral  attachments were taken down out to the apex.  The prostate was then reflected to the right and the left periprosthetic fascia was incised and the neurovascular bundle was dissected off the prostate in an intrafascial fashion.  The left pedicle was then taken down with the EnSeal device, and the prostate was dissected out toward the apex with taking out the residual lateral attachments.  At this point, the residual dorsal vein complex was incised with the cautery scissors, exposing anterior urethra.  The urethra was divided with cold shears.  He has a Foley catheter for visualization.  Once the posterior urethra had been divided, residual rectourethralis attachments were divided with sharp and blunt dissection.  At the left apex, the prostate capsule appeared to have torn, so care was taken to make a generous dissection around this area.  The prostate was then freed up and removed from the pelvic fossa.  At this point, the pelvic fossa was irrigated and insufflated  without evidence of rectal injury.  Of note, there had been constant but not excessive venous oozing, particularly on the right side during the dissection.  Inspection of the right neurovascular bundle revealed a vein that had been opened for approximately 2 cm, 3 and 4-0 Vicryl sutures were used to over sew this area with care being taken to try not to entrap the entire neurovascular bundle.  With this maneuver, he still had some oozing from this area and FloSeal was then applied to the right neurovascular bundle left in place for approximately 3 minutes before it was irrigated and aspirated out. There remained some oozing at this point, but it was felt that the progress to the urethrovesical anastomosis was indicated at this time.  A 2-0 Vicryl suture on an SH was then used to reapproximate the posterior urethra with the posterior bladder neck, with incorporation of some rectourethralis complex, and the cut edge of Denonvilliers fascia. A slip knot suture was used to pulley the two mucosal edges together.  Once this suture was then placed, a running bicolor 4-0 Monocryl was then used to complete the urethrovesical anastomosis.  I had an issue with the needle with the first attempt at the anastomosis, so after 3 froze, I had to remove the stitch and started again but with the second attempt, a water tight urethrovesical anastomosis was created.  Once the initial surgeon's knot was placed, a fresh Foley catheter was inserted and the bladder was irrigated with no evidence of leakage from the urethrovesical anastomosis.  The balloon was then filled with 15 mL of sterile fluid.  The patient had been given indigo carmine and efflux of that through the latter part of the case from the bladder neck without evidence of other efflux.  At this point, the anastomotic suture tie was completed.  There was still a bit of oozing from the right pelvic floor but most of that stopped with  completion of the anastomotic stitch.  An additional 10 mL of FloSeal was then applied to the area of the right neurovascular bundle, followed by some to the left neurovascular bundle. Once this had been in place for a couple of minutes, hemostasis appeared to be secured.  The fourth arm had been removed earlier.  The specimen was placed in an entrapment sac through the 12-mm accessory port and the prostate was placed in the entrapment sac.  A 19-French Blake drain was placed through the fourth arm port and positioned in the pelvis.  The port was then removed and  the drain was secured with a 3-0 nylon suture.  At this point, the robot was undocked.  The 12-mm assistant port was closed using a needle passer and a 0 Vicryl.  The remaining port sites were removed under direct vision, and the video port site was removed after transferring the entrapment sac drain to that port.  At this point, the infraumbilical incision was opened sufficiently to accommodate the specimen which was then removed from the field.  Once hemostasis was assured, the anterior rectus fascia was closed with a running 0 Vicryl suture.  The port sites were then infiltrated with 0.25% Marcaine.  The skin incisions were then closed with staples.  The Foley was placed to straight drainage, the Blake drain to bulb suction. The drapes were removed.  Dressings were applied.  The patient had been taken out of Trendelenburg position once the ports have all been removed.  He was taken down from the lithotomy position after removal of the red rubber rectal catheter.  His anesthetic was reversed and he was moved to recovery room in stable condition.  There were no major complications during the procedure, but he did have oozing throughout the procedure that resulted in 700 mL blood loss.     [name]. [name], [name]     JJW/MEDQ  D:  10/28/2011  T:  10/29/2011  Job:  [id number]  cc:   Neta Mends. Fabian Sharp, MD [address]  [place], [place] [address]

## 2011-10-29 NOTE — Progress Notes (Signed)
Order to gently flush Foley Catheter if urine flow stops. MD aware of add 80cc of JP drainage. Pt states his pain 4/10. Requesting medicine to help him sleep. Will give Ambien.

## 2011-10-29 NOTE — Progress Notes (Signed)
No urine flowing from catheter, per verbal order from Dr. Margarita Grizzle foley catheter gently irrigated with sterile water, catheter flushed easily, no clots noted. Pt states pain is beginning to increase again. Will monitor closely.

## 2011-10-29 NOTE — Progress Notes (Signed)
Call to Dr. Margarita Grizzle as pt urine output is low, 100cc for 3 hours- clear pink, no clots noted, JP Drain output over 2 hours. Pt states his pain remains a 6/10.

## 2011-10-29 NOTE — Progress Notes (Signed)
Pt resting quietly, eyes open, states his pain is 1/10. 70cc emptied from [facility], continues to drain pink thin serous fluid.

## 2011-10-29 NOTE — Progress Notes (Signed)
Pt now draining clear-pink urine. States his "bladder spasms" are much improved. Declines need for pain med at this time.

## 2011-10-29 NOTE — Progress Notes (Signed)
1 Day Post-Op Subjective: Patient reports pain control good.  Dizziness from last night improved.  Denies N/V.  Has had large amount of drainage from JP overnight.    Objective: Vital signs in last 24 hours: Temp:  [97.5 F (36.4 C)-98.7 F (37.1 C)] 98.4 F (36.9 C) (10/08 0512) Pulse Rate:  [74-85] 78  (10/08 0512) Resp:  [12-24] 16  (10/08 0512) BP: (96-144)/(53-89) 114/64 mmHg (10/08 0512) SpO2:  [93 %-100 %] 98 % (10/08 0512) Weight:  [108 kg (238 lb 1.6 oz)] 108 kg (238 lb 1.6 oz) (10/07 1215)  Intake/Output from previous day: 10/07 0701 - 10/08 0700 In: 6155.8 [P.O.:360; I.V.:3879.2; IV Piggyback:1816.7] Out: 3248 [Urine:1575; Drains:973; Blood:700] Intake/Output this shift: Total I/O In: 240 [P.O.:240] Out: 150 [Drains:150]  Physical Exam:  General:alert, cooperative and no distress Cardiovascular: RRR Lungs: BS unlabored and clear GI: soft, appropriate tenderness, decreased bowel sounds Incisions: dressings C/D/I Urine: yellow Extremities: SCDs in place  Lab Results:  Basename 10/29/11 0410 10/28/11 1125  HGB 11.5* 12.3*  HCT 34.5* 36.1*   BMET  Basename 10/29/11 0410  NA 136  K 3.4*  CL 100  CO2 26  GLUCOSE 165*  BUN 11  CREATININE 1.13  CALCIUM 8.0*   No results found for this basename: LABPT:3,INR:3 in the last 72 hours No results found for this basename: LABURIN:1 in the last 72 hours Results for orders placed during the hospital encounter of 10/24/11  SURGICAL PCR SCREEN     Status: Normal   Collection Time   10/24/11  2:10 PM      Component Value Range Status Comment   MRSA, PCR NEGATIVE  NEGATIVE Final    Staphylococcus aureus NEGATIVE  NEGATIVE Final     Studies/Results: No results found.  Assessment/Plan: 1 Day Post-Op, Procedure(s) (LRB): ROBOTIC ASSISTED LAPAROSCOPIC RADICAL PROSTATECTOMY (N/A) LAPAROSCOPIC LYSIS OF ADHESIONS ()  Ambulate, Incentive spirometry DVT prophylaxis Transition to PO pain medications Check drain  creatinine level Foley put on traction by Dr. [name] Advance diet to full liquids Will keep additional night for observation   LOS: 1 day   YARBROUGH,Alvie Speltz G. 10/29/2011, 10:04 AM

## 2011-10-29 NOTE — Progress Notes (Signed)
Patient ID: Daniel Randall, male   DOB: 1950/04/06, 61 y.o.   MRN: [id number]  His Hgb didn't drop further on the 11am draw.  His JP drainage seems to be declining with foley traction but his JP Cr was elevated at 79 which is consistent with urine.   Continue foley traction.

## 2011-10-30 LAB — HEMOGLOBIN AND HEMATOCRIT, BLOOD: Hemoglobin: 11.6 g/dL — ABNORMAL LOW (ref 13.0–17.0)

## 2011-10-30 MED ORDER — ENOXAPARIN SODIUM 60 MG/0.6ML ~~LOC~~ SOLN
50.0000 mg | SUBCUTANEOUS | Status: DC
  Administered 2011-10-30 – 2011-10-31 (×2): 50 mg via SUBCUTANEOUS
  Filled 2011-10-30 (×2): qty 0.6

## 2011-10-30 MED ORDER — BISACODYL 10 MG RE SUPP: 10.0000 mg | Freq: Every day | RECTAL | Status: DC | PRN

## 2011-10-30 NOTE — Progress Notes (Signed)
ANTICOAGULATION CONSULT NOTE - Initial Consult  Pharmacy Consult for Lovenox Indication: VTE Prophylaxis  Allergies  Allergen Reactions  . Quinapril Hcl     REACTION: cough    Patient Measurements: Height: 5\' 5"  (165.1 cm) Weight: 238 lb 1.6 oz (108 kg) IBW/kg (Calculated) : 61.5   Labs:  Basename 10/30/11 0410 10/29/11 1100 10/29/11 0410  HGB 11.6* 11.4* --  HCT 33.8* 33.3* 34.5*  PLT -- -- --  APTT -- -- --  LABPROT -- -- --  INR -- -- --  HEPARINUNFRC -- -- --  CREATININE -- -- 1.13  CKTOTAL -- -- --  CKMB -- -- --  TROPONINI -- -- --    Estimated Creatinine Clearance: 77.8 ml/min (by C-G formula based on Cr of 1.13).   Medical History: Past Medical History  Diagnosis Date  . Allergy   . Hyperlipidemia   . Hypertension   . History of renal stone   . Hx of bacterial pneumonia     hosp  2001  . Hx of diverticulitis of colon     hosp 2001  . Prostate ca 10-24-11    7'13- bx. of prostate + cancer-surgery planned    Assessment: 61 yo M s/p robotic assisted lap radical prostatectomy and LOA. Order to start Lovenox per Rx for VTE prophylaxis due to poor ambulation. No pertinent PMH, not on warfarin prior to admission, therefore will dose Lovenox in a prophylactic manner. Due to BMI > 30, will dose Lovenox at 0.5mg /kg q24h as per [facility] policy.  Goal of Therapy:  Prevention of VTE Monitor platelets by anticoagulation protocol: Yes   Plan:  1) Lovenox 50mg  SQ q24h 2) Pharmacy will sign off; no further dose adjustments anticipated, unless develops acute renal failure and/or thrombocytopenia/bleeding. Please reconsult if further assistance is required, or call [phone] to speak to a Pharmacist.  Darrol Angel, PharmD Pager: [phone] 10/30/2011,8:30 AM

## 2011-10-30 NOTE — Progress Notes (Signed)
Patient ID: Daniel Randall, male   DOB: 28-Dec-1950, 61 y.o.   MRN: [id number] 2 Days Post-Op  Subjective: Daniel Randall is doing ok this morning but he is more bloated and burping but reports no nausea.  He has not passed gas.  His JP drainage is greatly reduced with only 50cc additional output since rounds yesterday.  His UOP remains good.   His Hgb is stable.  ROS: Negative except some bloating.  He denies nausea or significant pain.  He denies calf tenderness.   Objective: Vital signs in last 24 hours: Temp:  [98.8 F (37.1 C)-100.3 F (37.9 C)] 99.1 F (37.3 C) (10/09 0533) Pulse Rate:  [86-97] 90  (10/09 0533) Resp:  [16-18] 16  (10/09 0533) BP: (151-155)/(74-79) 155/78 mmHg (10/09 0533) SpO2:  [94 %-100 %] 95 % (10/09 0533)  Intake/Output from previous day: 10/08 0701 - 10/09 0700 In: 1584.2 [P.O.:480; I.V.:1104.2] Out: 2925 [Urine:2725; Drains:200] Intake/Output this shift:    General appearance: alert and no distress Resp: clear to auscultation bilaterally Cardio: regular rate and rhythm GI: Quiet with distention.    Lab Results:   Basename 10/30/11 0410 10/29/11 1100  WBC -- --  HGB 11.6* 11.4*  HCT 33.8* 33.3*  PLT -- --   BMET  Basename 10/29/11 0410  NA 136  K 3.4*  CL 100  CO2 26  GLUCOSE 165*  BUN 11  CREATININE 1.13  CALCIUM 8.0*   PT/INR No results found for this basename: LABPROT:2,INR:2 in the last 72 hours ABG No results found for this basename: PHART:2,PCO2:2,PO2:2,HCO3:2 in the last 72 hours  Studies/Results: No results found.  Anti-infectives: Anti-infectives     Start     Dose/Rate Route Frequency Ordered Stop   10/28/11 0521   ceFAZolin (ANCEF) IVPB 2 g/50 mL premix  Status:  Discontinued        2 g 100 mL/hr over 30 Minutes Intravenous 30 min pre-op 10/28/11 0521 10/28/11 1204   10/28/11 0000   ciprofloxacin (CIPRO) 500 MG tablet        500 mg Oral 2 times daily 10/28/11 1106            Current Facility-Administered Medications    Medication Dose Route Frequency Provider Last Rate Last Dose  . acetaminophen (OFIRMEV) IV 1,000 mg  1,000 mg Intravenous Q6H Marcine Matar, MD   1,000 mg at 10/30/11 0520  . bisacodyl (DULCOLAX) suppository 10 mg  10 mg Rectal Daily PRN Anner Crete, MD      . ezetimibe-simvastatin (VYTORIN) 10-40 MG per tablet 1 tablet  1 tablet Oral Daily Darol Destine. Myer Haff, PA   1 tablet at 10/29/11 0935  . fluticasone (FLONASE) 50 MCG/ACT nasal spray 2 spray  2 spray Each Nare Daily Darol Destine. Myer Haff, PA   2 spray at 10/28/11 1311  . hydrochlorothiazide (HYDRODIURIL) tablet 25 mg  25 mg Oral Daily Anner Crete, MD   25 mg at 10/29/11 0936  . HYDROcodone-acetaminophen (NORCO/VICODIN) 5-325 MG per tablet 1-2 tablet  1-2 tablet Oral Q4H PRN Darol Destine. Myer Haff, PA   2 tablet at 10/30/11 0117  . HYDROmorphone (DILAUDID) injection 1 mg  1 mg Intravenous Q2H PRN Marcine Matar, MD   1 mg at 10/30/11 0227  . influenza  inactive virus vaccine (FLUZONE/FLUARIX) injection 0.5 mL  0.5 mL Intramuscular Tomorrow-1000 Anner Crete, MD   0.5 mL at 10/29/11 0938  . irbesartan (AVAPRO) tablet 300 mg  300 mg Oral Daily Anner Crete, MD  300 mg at 10/29/11 0935  . opium-belladonna (B&O SUPPRETTES) suppository 1 suppository  1 suppository Rectal Q6H PRN Milford Cage, MD   1 suppository at 10/28/11 2225  . oxybutynin (DITROPAN) tablet 5 mg  5 mg Oral Q8H PRN Darol Destine. Myer Haff, PA      . pneumococcal 23 valent vaccine (PNU-IMMUNE) injection 0.5 mL  0.5 mL Intramuscular Tomorrow-1000 Anner Crete, MD   0.5 mL at 10/29/11 0939  . zolpidem (AMBIEN) tablet 5 mg  5 mg Oral QHS PRN Anner Crete, MD   5 mg at 10/29/11 0029  . DISCONTD: dextrose 5 % and 0.45 % NaCl with KCl 10 mEq/L infusion   Intravenous Continuous Darol Destine. Myer Haff, [place] 125 mL/hr at 10/29/11 0758    . DISCONTD: HYDROmorphone (DILAUDID) injection 1 mg  1 mg Intravenous Q2H PRN Milford Cage, MD   1 mg at 10/29/11 4098    Assessment: s/p  Procedure(s): ROBOTIC ASSISTED LAPAROSCOPIC RADICAL PROSTATECTOMY LAPAROSCOPIC LYSIS OF ADHESIONS  He has a postop ileus. Acute blood loss anemia with a stable Hgb.  Plan: I have encouraged increased ambulation and will give a dulcolax suppository. He will stick with full liquids but was cautioned to take it easy with the diet. I am going to start lovenox, since he has not been ambulating as well as I would like.  Incentive Spirometry encouraged. He needs to be reassessed mid day and if the JP drainage remains low that can be removed. Discharge will depend on his progress and could be this afternoon or tomorrow depending on resolution of the ileus.    LOS: 2 days    Anner Crete 10/30/2011

## 2011-10-31 ENCOUNTER — Encounter (HOSPITAL_COMMUNITY): Payer: Self-pay | Admitting: Urology

## 2011-10-31 DIAGNOSIS — C61 Malignant neoplasm of prostate: Secondary | ICD-10-CM

## 2011-10-31 HISTORY — DX: Malignant neoplasm of prostate: C61

## 2011-10-31 NOTE — Progress Notes (Signed)
3 Days Post-Op Subjective: Patient reports pain control good.  Pt denies N/V.  Had regular BM this morning and has been ambulating.  Objective: Vital signs in last 24 hours: Temp:  [98.5 F (36.9 C)-99.5 F (37.5 C)] 98.5 F (36.9 C) (10/10 0532) Pulse Rate:  [95-101] 96  (10/10 0532) Resp:  [16] 16  (10/10 0532) BP: (115-157)/(58-81) 157/81 mmHg (10/10 0532) SpO2:  [96 %-99 %] 99 % (10/10 0532)  Intake/Output from previous day: 10/09 0701 - 10/10 0700 In: 680 [P.O.:360; IV Piggyback:300] Out: 1910 [Urine:1900; Drains:10] Intake/Output this shift:    Physical Exam:  General:alert, cooperative and no distress Cardiovascular: RRR Lungs: CTA GI: soft, non tender, normal bowel sounds Incisions: C/D/I Urine: clear Extremities: warm with SCDs  Lab Results:  Basename 10/30/11 0410 10/29/11 1100 10/29/11 0410  HGB 11.6* 11.4* 11.5*  HCT 33.8* 33.3* 34.5*   BMET  Basename 10/29/11 0410  NA 136  K 3.4*  CL 100  CO2 26  GLUCOSE 165*  BUN 11  CREATININE 1.13  CALCIUM 8.0*   No results found for this basename: LABPT:3,INR:3 in the last 72 hours No results found for this basename: LABURIN:1 in the last 72 hours Results for orders placed during the hospital encounter of 10/24/11  SURGICAL PCR SCREEN     Status: Normal   Collection Time   10/24/11  2:10 PM      Component Value Range Status Comment   MRSA, PCR NEGATIVE  NEGATIVE Final    Staphylococcus aureus NEGATIVE  NEGATIVE Final     Studies/Results: No results found.  Assessment/Plan: 3 Days Post-Op, Procedure(s) (LRB): ROBOTIC ASSISTED LAPAROSCOPIC RADICAL PROSTATECTOMY (N/A) LAPAROSCOPIC LYSIS OF ADHESIONS ()  Ambulate, Incentive spirometry DVT prophylaxis Advance diet D/c home   LOS: 3 days   YARBROUGH,Tikita Mabee G. 10/31/2011, 10:14 AM

## 2011-11-07 NOTE — Discharge Summary (Signed)
Date of admission: 10/28/2011  Date of discharge: 10/31/11  Admission diagnosis: Prostate Cancer  Discharge diagnosis: Prostate Cancer  History and Physical: For full details, please see admission history and physical. Briefly, Daniel Randall is a 61 y.o. gentleman with localized prostate cancer.  After discussing management/treatment options, he elected to proceed with surgical treatment.  Hospital Course: Daniel Randall was taken to the operating room on 10/28/2011 and underwent a robotic assisted laparoscopic radical prostatectomy. He tolerated this procedure well and without complications. Postoperatively, he was able to be transferred to a regular hospital room following recovery from anesthesia.  He was able to begin ambulating the night of surgery although this was minimal secondary to mild orthostatic hypotension.  This resolved with a fluid bolus and he remained hemodynamically stable for the remainder of the hospital course.   He did have an acute blood loss anemia but this also stabilized without intervention.  He had excellent urine output.  He had a large amount of JP drainage through the operative day and POD 1.  Drainage was checked and Cr value was equivalent to urine indicating a urine leak.  His foley was placed on traction and his drainage began to decrease.  His drain was removed on POD 2.   Pt bowel function returned on POD 3 and his diet was advanced accordingly.   He was transitioned to oral pain medication and had met all discharge criteria and was able to be discharged home later on POD# 3.  Laboratory values:  Lab Results  Component Value Date   CREATININE 1.13 10/29/2011   Lab Results  Component Value Date   WBC 7.5 10/24/2011   HGB 11.6* 10/30/2011   HCT 33.8* 10/30/2011   MCV 84.0 10/24/2011   PLT 241 10/24/2011    Disposition: Home  Discharge instruction: He was instructed to be ambulatory but to refrain from heavy lifting, strenuous activity, or driving. He was  instructed on urethral catheter care.  Discharge medications:     Medication List     As of 11/07/2011  9:00 AM    START taking these medications         ciprofloxacin 500 MG tablet   Commonly known as: CIPRO   Take 1 tablet (500 mg total) by mouth 2 (two) times daily. Start day prior to office visit for foley removal      HYDROcodone-acetaminophen 5-325 MG per tablet   Commonly known as: NORCO/VICODIN   Take 1-2 tablets by mouth every 6 (six) hours as needed for pain.      CONTINUE taking these medications         ezetimibe-simvastatin 10-40 MG per tablet   Commonly known as: VYTORIN      fluticasone 50 MCG/ACT nasal spray   Commonly known as: FLONASE   Place 2 sprays into the nose daily.      valsartan-hydrochlorothiazide 320-25 MG per tablet   Commonly known as: DIOVAN-HCT      STOP taking these medications         aspirin 81 MG tablet      FISH OIL PO      multivitamin with minerals Tabs          Where to get your medications    These are the prescriptions that you need to pick up.   You may get these medications from any pharmacy.         ciprofloxacin 500 MG tablet   HYDROcodone-acetaminophen 5-325 MG per tablet  Followup: He will followup in 1 week for possible catheter removal after cystogram to eval urine leak and to discuss his surgical pathology results.

## 2011-11-10 ENCOUNTER — Other Ambulatory Visit: Payer: Self-pay | Admitting: Internal Medicine

## 2011-12-30 ENCOUNTER — Other Ambulatory Visit: Payer: Self-pay | Admitting: Dermatology

## 2011-12-30 DIAGNOSIS — D229 Melanocytic nevi, unspecified: Secondary | ICD-10-CM

## 2011-12-30 HISTORY — DX: Melanocytic nevi, unspecified: D22.9

## 2012-01-23 ENCOUNTER — Other Ambulatory Visit: Payer: Self-pay | Admitting: Physician Assistant

## 2012-03-12 ENCOUNTER — Telehealth: Payer: Self-pay | Admitting: Internal Medicine

## 2012-03-12 NOTE — Telephone Encounter (Signed)
Patient Information:  Caller Name: Kishawn  Phone: [phone]  Patient: Daniel Randall, Daniel Randall  Gender: Male  DOB: 14-Feb-1950  Age: 62 Years  PCP: Berniece Andreas [facility])  Office Follow Up:  Does the office need to follow up with this patient?: No  Instructions For The Office: N/A  RN Note:  Pain is intermittent. No shortness of breath. States has it "once a year" but has never lasted this long". Refuses appointment.  Symptoms  Reason For Call & Symptoms: Has pain on chest "under left arm" for past 10 days". Thinks is due to exercizing.  Reviewed Health History In EMR: Yes  Reviewed Medications In EMR: Yes  Reviewed Allergies In EMR: Yes  Reviewed Surgeries / Procedures: Yes  Date of Onset of Symptoms: 03/02/2012  Guideline(s) Used:  Chest Pain  Disposition Per Guideline:   See Today in Office  Reason For Disposition Reached:   Intermittent chest pains persist > 3 days  Advice Given:  N/A  Patient Refused Recommendation:  Patient Will Make Own Appointment  States does not think needs to see doctor unless worsens.

## 2012-03-16 ENCOUNTER — Other Ambulatory Visit: Payer: Self-pay | Admitting: Internal Medicine

## 2012-05-05 ENCOUNTER — Other Ambulatory Visit: Payer: BC Managed Care – PPO

## 2012-05-06 ENCOUNTER — Other Ambulatory Visit (INDEPENDENT_AMBULATORY_CARE_PROVIDER_SITE_OTHER): Payer: BC Managed Care – PPO

## 2012-05-06 DIAGNOSIS — Z0000 Encounter for general adult medical examination without abnormal findings: Secondary | ICD-10-CM

## 2012-05-06 LAB — HEPATIC FUNCTION PANEL
ALT: 30 U/L (ref 0–53)
AST: 26 U/L (ref 0–37)
Alkaline Phosphatase: 55 U/L (ref 39–117)
Bilirubin, Direct: 0.1 mg/dL (ref 0.0–0.3)
Total Protein: 7.2 g/dL (ref 6.0–8.3)

## 2012-05-06 LAB — CBC WITH DIFFERENTIAL/PLATELET
Basophils Relative: 0.2 % (ref 0.0–3.0)
Eosinophils Relative: 8.2 % — ABNORMAL HIGH (ref 0.0–5.0)
Lymphocytes Relative: 24 % (ref 12.0–46.0)
Monocytes Relative: 8.5 % (ref 3.0–12.0)
Neutrophils Relative %: 59.1 % (ref 43.0–77.0)
RBC: 4.92 Mil/uL (ref 4.22–5.81)
WBC: 6.4 10*3/uL (ref 4.5–10.5)

## 2012-05-06 LAB — BASIC METABOLIC PANEL
Calcium: 9.5 mg/dL (ref 8.4–10.5)
Creatinine, Ser: 1.2 mg/dL (ref 0.4–1.5)
Sodium: 143 mEq/L (ref 135–145)

## 2012-05-06 LAB — LIPID PANEL
Cholesterol: 188 mg/dL (ref 0–200)
LDL Cholesterol: 112 mg/dL — ABNORMAL HIGH (ref 0–99)
Total CHOL/HDL Ratio: 5
VLDL: 38.8 mg/dL (ref 0.0–40.0)

## 2012-05-10 ENCOUNTER — Other Ambulatory Visit: Payer: Self-pay | Admitting: Internal Medicine

## 2012-05-11 ENCOUNTER — Encounter: Payer: Self-pay | Admitting: Family Medicine

## 2012-05-12 ENCOUNTER — Ambulatory Visit (INDEPENDENT_AMBULATORY_CARE_PROVIDER_SITE_OTHER): Payer: BC Managed Care – PPO | Admitting: Internal Medicine

## 2012-05-12 ENCOUNTER — Encounter: Payer: Self-pay | Admitting: Internal Medicine

## 2012-05-12 VITALS — BP 138/86 | HR 85 | Temp 98.4°F | Ht 65.25 in | Wt 246.0 lb

## 2012-05-12 DIAGNOSIS — E785 Hyperlipidemia, unspecified: Secondary | ICD-10-CM

## 2012-05-12 DIAGNOSIS — C61 Malignant neoplasm of prostate: Secondary | ICD-10-CM

## 2012-05-12 DIAGNOSIS — E669 Obesity, unspecified: Secondary | ICD-10-CM

## 2012-05-12 DIAGNOSIS — R071 Chest pain on breathing: Secondary | ICD-10-CM

## 2012-05-12 DIAGNOSIS — Z23 Encounter for immunization: Secondary | ICD-10-CM

## 2012-05-12 DIAGNOSIS — R0789 Other chest pain: Secondary | ICD-10-CM | POA: Insufficient documentation

## 2012-05-12 DIAGNOSIS — Z0000 Encounter for general adult medical examination without abnormal findings: Secondary | ICD-10-CM

## 2012-05-12 DIAGNOSIS — I10 Essential (primary) hypertension: Secondary | ICD-10-CM

## 2012-05-12 NOTE — Patient Instructions (Signed)
Weight loss will help the BP lipids and energy level The chest shoulder are seems like chest wasll but if  persistent or progressive contact us for revaluation  bp readings should be below 140/90 .   ROV in 6 months or as needed

## 2012-05-12 NOTE — Progress Notes (Signed)
Chief Complaint  Patient presents with  . Annual Exam    Does not want the shingles vaccine unless necessary.    HPI: Patient comes in today for Preventive Health Care visit  Hypertension taking medication no side effect not checking his readings As gained weight over the holidays Lipids taking medication denies any noted side effects. Prostate cancer is doing well PSA has been 0 on a 6 month check. Last seen February Minor concern has a remote history of some left-sided transient muscle discomfort when he was doing weights and younger now he is getting at a couple times a day lasting seconds in a migratory pattern under his shoulder left upper chest without radiation otherwise and no associated shortness of breath. Not really related to the ED needed.  When he does his walking he doesn't get the symptoms. Of note he has had shoulder surgery on the left when he was about 81 or 62 years of age.  No history of depression sleep is okay. Daughters getting married in the fall.  ROS:  GEN/ HEENT: No fever, significant weight changes sweats headaches vision problems hearing changes, CV/ PULM; No chest pain shortness of breath cough, syncope,edema  change in exercise tolerance. GI /GU: No adominal pain, vomiting, change in bowel habits. No blood in the stool. No significant GU symptoms. SKIN/HEME: ,no acute skin rashes suspicious lesions or bleeding. No lymphadenopathy, nodules, masses.  NEURO/ PSYCH:  No neurologic signs such as weakness numbness. No depression anxiety. IMM/ Allergy: No unusual infections.  Allergy .   REST of 12 system review negative except as per HPI   Past Medical History  Diagnosis Date  . Allergy   . Hyperlipidemia   . Hypertension   . History of renal stone   . Hx of bacterial pneumonia     hosp  2001  . Hx of diverticulitis of colon     hosp 2001  . Prostate ca 10-24-11    7'13- bx. of prostate + cancer-surgery planned  . Prostate cancer 10/31/2011   Past  Surgical History  Procedure Laterality Date  . Shoulder surgery      lt. arthroscopy  . Wisdom tooth extraction  10-24-11    x2 extracted  . Robot assisted laparoscopic radical prostatectomy  10/28/2011    Procedure: ROBOTIC ASSISTED LAPAROSCOPIC RADICAL PROSTATECTOMY;  Surgeon: Anner Crete, MD;  Location: [facility];  Service: Urology;  Laterality: N/A;    Family History  Problem Relation Age of Onset  . Diabetes Mother   . Colon cancer Father   . Cancer Father     colon  . Diabetes Sister     borderline  . Diabetes Brother     borderline  . ADD / ADHD Son     History   Social History  . Marital Status: Married    Spouse Name: N/A    Number of Children: N/A  . Years of Education: N/A   Social History Main Topics  . Smoking status: Never Smoker   . Smokeless tobacco: None  . Alcohol Use: Yes     Comment: very use 1-2 per yr  . Drug Use: No  . Sexually Active: Not Currently   Other Topics Concern  . 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    Accountant   2 cats and a dog          Outpatient Encounter Prescriptions as of 05/12/2012  Medication  Sig Dispense Refill  . fluticasone (FLONASE) 50 MCG/ACT nasal spray USE 2 SPRAYS NASALLY DAILY  16 g  3  . HYDROcodone-acetaminophen (NORCO) 5-325 MG per tablet Take 1-2 tablets by mouth every 6 (six) hours as needed for pain.  30 tablet  0  . VYTORIN 10-40 MG per tablet TAKE 1 TABLET AT BEDTIME  90 tablet  0  . [DISCONTINUED] valsartan-hydrochlorothiazide (DIOVAN-HCT) 320-25 MG per tablet Take 1 tablet by mouth daily before breakfast.      . [DISCONTINUED] ciprofloxacin (CIPRO) 500 MG tablet Take 1 tablet (500 mg total) by mouth 2 (two) times daily. Start day prior to office visit for foley removal  6 tablet  0  . [DISCONTINUED] ezetimibe-simvastatin (VYTORIN) 10-40 MG per tablet Take 1 tablet by mouth daily before breakfast.       No facility-administered encounter medications on file as of  05/12/2012.    EXAM:  BP 138/86  Pulse 85  Temp(Src) 98.4 F (36.9 C) (Oral)  Ht 5' 5.25" (1.657 m)  Wt 246 lb (111.585 kg)  BMI 40.64 kg/m2  SpO2 97%  Body mass index is 40.64 kg/(m^2). Wt Readings from Last 3 Encounters:  05/12/12 246 lb (111.585 kg)  10/28/11 238 lb 1.6 oz (108 kg)  10/28/11 238 lb 1.6 oz (108 kg)    Physical Exam: Vital signs reviewed WUJ:WJXB is a well-developed well-nourished alert cooperative   male who appears stated age in no acute distress.  HEENT: normocephalic atraumatic , Eyes: PERRL EOM's full, conjunctiva clear, Nares: paten,t no deformity discharge or tenderness., Ears: no deformity EAC's clear TMs with normal landmarks. Mouth: clear OP, no lesions, edema.  Moist mucous membranes. Dentition in adequate repair. NECK: supple without masses, thyromegaly or bruits. CHEST/PULM:  Clear to auscultation and percussion breath sounds equal no wheeze , rales or rhonchi. No chest wall deformities or tenderness. Shoulder has generally normal range of motion CV: PMI is nondisplaced, S1 S2 no gallops, murmurs, rubs. Peripheral pulses are full without delay.No JVD .  ABDOMEN: Bowel sounds normal nontender  No guard or rebound, no hepato splenomegal no CVA tenderness.  No hernia. Protuberant Extremtities:  No clubbing cyanosis or edema, no acute joint swelling or redness no focal atrophy NEURO:  Oriented x3, cranial nerves 3-12 appear to be intact, no obvious focal weakness,gait within normal limits no abnormal reflexes or asymmetrical SKIN: No acute rashes normal turgor, color, no bruising or petechiae. PSYCH: Oriented, good eye contact, no obvious depression anxiety, cognition and judgment appear normal. LN: no cervical axillary inguinal adenopathy  Lab Results  Component Value Date   WBC 6.4 05/06/2012   HGB 14.2 05/06/2012   HCT 42.4 05/06/2012   PLT 301.0 05/06/2012   GLUCOSE 104* 05/06/2012   CHOL 188 05/06/2012   TRIG 194.0* 05/06/2012   HDL 36.90* 05/06/2012    LDLCALC 112* 05/06/2012   ALT 30 05/06/2012   AST 26 05/06/2012   NA 143 05/06/2012   K 4.5 05/06/2012   CL 102 05/06/2012   CREATININE 1.2 05/06/2012   BUN 14 05/06/2012   CO2 27 05/06/2012   TSH 3.75 05/06/2012   PSA 0.00* 05/06/2012   HGBA1C 6.2 05/26/2008    ASSESSMENT AND PLAN:  Discussed the following assessment and plan:  Visit for preventive health examination  HYPERTENSION - Borderline up today better on repeat lifestyle intervention continue medications  HYPERLIPIDEMIA - No reported side effects worsening profile probably related to weight gain lifestyle reintensify lifestyle  Prostate cancer  OBESITY - Weight loss  advised  Left-sided chest wall pain - Atypical pain.cardiovascular cause expectant management followup of persistent progressive or alarming  Need for diphtheria-tetanus-pertussis (Tdap) vaccine - Plan: Tdap vaccine greater than or equal to 7yo IM Discussed weight loss healthy lifestyle eating.Counseled regarding healthy nutrition, exercise, sleep, injury prevention, calcium vit d and healthy weight .  Patient Care Team: Madelin Headings, MD as PCP - General Louis Meckel, MD (Gastroenterology) Patient Instructions  Weight loss will help the BP lipids and energy level The chest shoulder are seems like chest wasll but if  persistent or progressive contact us for revaluation  bp readings should be below 140/90 .   ROV in 6 months or as needed     Neta Mends. Jake Fuhrmann [name]  Health Maintenance  Topic Date Due  . Zostavax  11/28/2009  . Influenza Vaccine  09/21/2012  . Colonoscopy  04/12/2017  . Tetanus/tdap  05/13/2022   Health Maintenance Review

## 2012-06-23 ENCOUNTER — Other Ambulatory Visit: Payer: Self-pay | Admitting: Internal Medicine

## 2012-06-23 MED ORDER — FLUTICASONE PROPIONATE 50 MCG/ACT NA SUSP: NASAL | Status: DC

## 2012-08-19 ENCOUNTER — Other Ambulatory Visit: Payer: Self-pay | Admitting: Dermatology

## 2012-11-03 ENCOUNTER — Ambulatory Visit (INDEPENDENT_AMBULATORY_CARE_PROVIDER_SITE_OTHER): Payer: BC Managed Care – PPO | Admitting: Internal Medicine

## 2012-11-03 ENCOUNTER — Encounter: Payer: Self-pay | Admitting: Internal Medicine

## 2012-11-03 VITALS — BP 130/78 | HR 91 | Temp 98.0°F | Wt 242.0 lb

## 2012-11-03 DIAGNOSIS — E669 Obesity, unspecified: Secondary | ICD-10-CM | POA: Insufficient documentation

## 2012-11-03 DIAGNOSIS — E785 Hyperlipidemia, unspecified: Secondary | ICD-10-CM

## 2012-11-03 DIAGNOSIS — C61 Malignant neoplasm of prostate: Secondary | ICD-10-CM

## 2012-11-03 DIAGNOSIS — I10 Essential (primary) hypertension: Secondary | ICD-10-CM

## 2012-11-03 NOTE — Patient Instructions (Signed)
Intensify lifestyle interventions. 150 minutes of exercise weeks  ,  Lose weight  To healthy levels. Avoid trans fats and processed foods;  Increase fresh fruits and veges to 5 servings per day. And avoid sweet beverages  Including tea and juice.

## 2012-11-03 NOTE — Progress Notes (Signed)
Chief Complaint  Patient presents with  . Follow-up    HPI: Patient comes in today for follow up of  multiple medical problems.   Has seen urology  No change PSA.  stable.  BP no change meds no se .  LIPIDS:    se of meds  .   Weight; snacks are a indiscretiona work .   NO cp sob fever declines flu vaccine  " doesn't get these".    ROS: See pertinent positives and negatives per HPI.  Past Medical History  Diagnosis Date  . Allergy   . Hyperlipidemia   . Hypertension   . History of renal stone   . Hx of bacterial pneumonia     hosp  2001  . Hx of diverticulitis of colon     hosp 2001  . Prostate ca 10-24-11    7'13- bx. of prostate + cancer-surgery planned  . Prostate cancer 10/31/2011    Family History  Problem Relation Age of Onset  . Diabetes Mother   . Colon cancer Father   . Cancer Father     colon  . Diabetes Sister     borderline  . Diabetes Brother     borderline  . ADD / ADHD Son     History   Social History  . Marital Status: Married    Spouse Name: N/A    Number of Children: N/A  . Years of Education: N/A   Social History Main Topics  . Smoking status: Never Smoker   . Smokeless tobacco: None  . Alcohol Use: Yes     Comment: very use 1-2 per yr  . Drug Use: No  . Sexual Activity: Not Currently   Other Topics Concern  . 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    Accountant   2 cats and a dog          Outpatient Encounter Prescriptions as of 11/03/2012  Medication Sig Dispense Refill  . aspirin 81 MG tablet Take 81 mg by mouth daily.      . fluticasone (FLONASE) 50 MCG/ACT nasal spray USE 2 SPRAYS NASALLY DAILY  48 g  2  . valsartan-hydrochlorothiazide (DIOVAN-HCT) 320-25 MG per tablet TAKE 1 TABLET DAILY  90 tablet  3  . VYTORIN 10-40 MG per tablet TAKE 1 TABLET AT BEDTIME  90 tablet  2  . [DISCONTINUED] HYDROcodone-acetaminophen (NORCO) 5-325 MG per tablet Take 1-2 tablets by mouth every 6 (six) hours  as needed for pain.  30 tablet  0   No facility-administered encounter medications on file as of 11/03/2012.    EXAM:  BP 130/78  Pulse 91  Temp(Src) 98 F (36.7 C) (Oral)  Wt 242 lb (109.77 kg)  BMI 39.98 kg/m2  SpO2 97%  Body mass index is 39.98 kg/(m^2).  GENERAL: vitals reviewed and listed above, alert, oriented, appears well hydrated and in no acute distress NECK: no obvious masses on inspection palpation  LUNGS: clear to auscultation bilaterally, no wheezes, rales or rhonchi, good air movement CV: HRRR, no clubbing cyanosis  nl cap refill  MS: moves all extremities without noticeable focal  abnormality PSYCH: pleasant and cooperative, no obvious depression or anxiety Lab Results  Component Value Date   WBC 6.4 05/06/2012   HGB 14.2 05/06/2012   HCT 42.4 05/06/2012   PLT 301.0 05/06/2012   GLUCOSE 104* 05/06/2012   CHOL 188 05/06/2012   TRIG 194.0* 05/06/2012  HDL 36.90* 05/06/2012   LDLCALC 112* 05/06/2012   ALT 30 05/06/2012   AST 26 05/06/2012   NA 143 05/06/2012   K 4.5 05/06/2012   CL 102 05/06/2012   CREATININE 1.2 05/06/2012   BUN 14 05/06/2012   CO2 27 05/06/2012   TSH 3.75 05/06/2012   PSA 0.00* 05/06/2012   HGBA1C 6.2 05/26/2008   Wt Readings from Last 3 Encounters:  11/03/12 242 lb (109.77 kg)  05/12/12 246 lb (111.585 kg)  10/28/11 238 lb 1.6 oz (108 kg)    ASSESSMENT AND PLAN:  Discussed the following assessment and plan:  HYPERTENSION  HYPERLIPIDEMIA  Prostate cancer Counseled  No change meds at this time -Patient advised to return or notify health care team  if symptoms worsen or persist or new concerns arise.  Patient Instructions  Intensify lifestyle interventions. 150 minutes of exercise weeks  ,  Lose weight  To healthy levels. Avoid trans fats and processed foods;  Increase fresh fruits and veges to 5 servings per day. And avoid sweet beverages  Including tea and juice.    Neta Mends. Panosh [name]

## 2012-11-11 ENCOUNTER — Ambulatory Visit: Payer: BC Managed Care – PPO | Admitting: Internal Medicine

## 2013-01-04 ENCOUNTER — Other Ambulatory Visit: Payer: Self-pay | Admitting: Dermatology

## 2013-01-04 DIAGNOSIS — C4491 Basal cell carcinoma of skin, unspecified: Secondary | ICD-10-CM

## 2013-01-04 HISTORY — DX: Basal cell carcinoma of skin, unspecified: C44.91

## 2013-05-07 ENCOUNTER — Other Ambulatory Visit (INDEPENDENT_AMBULATORY_CARE_PROVIDER_SITE_OTHER): Payer: BC Managed Care – PPO

## 2013-05-07 DIAGNOSIS — Z0000 Encounter for general adult medical examination without abnormal findings: Secondary | ICD-10-CM

## 2013-05-07 LAB — CBC WITH DIFFERENTIAL/PLATELET
Basophils Absolute: 0 10*3/uL (ref 0.0–0.1)
Basophils Relative: 0.3 % (ref 0.0–3.0)
EOS PCT: 6.8 % — AB (ref 0.0–5.0)
Eosinophils Absolute: 0.5 10*3/uL (ref 0.0–0.7)
HEMATOCRIT: 42.6 % (ref 39.0–52.0)
Hemoglobin: 14.3 g/dL (ref 13.0–17.0)
LYMPHS ABS: 1.7 10*3/uL (ref 0.7–4.0)
Lymphocytes Relative: 23.7 % (ref 12.0–46.0)
MCHC: 33.6 g/dL (ref 30.0–36.0)
MCV: 86.9 fl (ref 78.0–100.0)
MONOS PCT: 10.4 % (ref 3.0–12.0)
Monocytes Absolute: 0.7 10*3/uL (ref 0.1–1.0)
NEUTROS PCT: 58.8 % (ref 43.0–77.0)
Neutro Abs: 4.2 10*3/uL (ref 1.4–7.7)
PLATELETS: 300 10*3/uL (ref 150.0–400.0)
RBC: 4.91 Mil/uL (ref 4.22–5.81)
RDW: 14.2 % (ref 11.5–14.6)
WBC: 7.1 10*3/uL (ref 4.5–10.5)

## 2013-05-07 LAB — LIPID PANEL
CHOL/HDL RATIO: 4
Cholesterol: 171 mg/dL (ref 0–200)
HDL: 43 mg/dL (ref >39.00)
LDL CALC: 97 mg/dL (ref 0–99)
Triglycerides: 153 mg/dL — ABNORMAL HIGH (ref 0.0–149.0)
VLDL: 30.6 mg/dL (ref 0.0–40.0)

## 2013-05-07 LAB — TSH: TSH: 3.12 u[IU]/mL (ref 0.35–5.50)

## 2013-05-07 LAB — HEPATIC FUNCTION PANEL
ALT: 26 U/L (ref 0–53)
AST: 21 U/L (ref 0–37)
Albumin: 3.8 g/dL (ref 3.5–5.2)
Alkaline Phosphatase: 54 U/L (ref 39–117)
BILIRUBIN DIRECT: 0.1 mg/dL (ref 0.0–0.3)
BILIRUBIN TOTAL: 0.8 mg/dL (ref 0.3–1.2)
Total Protein: 7 g/dL (ref 6.0–8.3)

## 2013-05-07 LAB — BASIC METABOLIC PANEL
BUN: 17 mg/dL (ref 6–23)
CHLORIDE: 101 meq/L (ref 96–112)
CO2: 31 meq/L (ref 19–32)
Calcium: 9.5 mg/dL (ref 8.4–10.5)
Creatinine, Ser: 1.1 mg/dL (ref 0.4–1.5)
GFR: 72.52 mL/min (ref >60.00)
GLUCOSE: 101 mg/dL — AB (ref 70–99)
Potassium: 4.5 mEq/L (ref 3.5–5.1)
Sodium: 141 mEq/L (ref 135–145)

## 2013-05-07 LAB — PSA: PSA: 0 ng/mL — ABNORMAL LOW (ref 0.10–4.00)

## 2013-05-14 ENCOUNTER — Ambulatory Visit (INDEPENDENT_AMBULATORY_CARE_PROVIDER_SITE_OTHER): Payer: BC Managed Care – PPO | Admitting: Internal Medicine

## 2013-05-14 ENCOUNTER — Encounter: Payer: Self-pay | Admitting: Internal Medicine

## 2013-05-14 VITALS — BP 124/80 | HR 80 | Temp 97.8°F | Ht 65.0 in | Wt 240.0 lb

## 2013-05-14 DIAGNOSIS — Z0000 Encounter for general adult medical examination without abnormal findings: Secondary | ICD-10-CM

## 2013-05-14 DIAGNOSIS — E785 Hyperlipidemia, unspecified: Secondary | ICD-10-CM

## 2013-05-14 DIAGNOSIS — C61 Malignant neoplasm of prostate: Secondary | ICD-10-CM

## 2013-05-14 DIAGNOSIS — E669 Obesity, unspecified: Secondary | ICD-10-CM

## 2013-05-14 DIAGNOSIS — I10 Essential (primary) hypertension: Secondary | ICD-10-CM

## 2013-05-14 MED ORDER — EZETIMIBE-SIMVASTATIN 10-40 MG PO TABS: ORAL_TABLET | ORAL | Status: DC

## 2013-05-14 MED ORDER — VALSARTAN-HYDROCHLOROTHIAZIDE 320-25 MG PO TABS: ORAL_TABLET | ORAL | Status: DC

## 2013-05-14 MED ORDER — FLUTICASONE PROPIONATE 50 MCG/ACT NA SUSP: NASAL | Status: DC

## 2013-05-14 NOTE — Progress Notes (Signed)
Chief Complaint  Patient presents with  . Annual Exam    HPI: Patient comes in today for Preventive Health Care visit  Followed  By uro for  prostate cancer  Doing well  Some fatigue in evening   8 + hours  No osa.   Began going to gym  And walking. Feels better  Coffee in am .  ? nerw derm gets lesions  That need to be bx and needs surveillance  Bp ok takign med LIPIDI taking med Wt Readings from Last 3 Encounters:  05/14/13 240 lb (108.863 kg)  11/03/12 242 lb (109.77 kg)  05/12/12 246 lb (111.585 kg)   Health Maintenance  Topic Date Due  . Zostavax  11/28/2009  . Influenza Vaccine  08/21/2013  . Colonoscopy  04/12/2017  . Tetanus/tdap  05/13/2022   Health Maintenance Review   ROS:  GEN/ HEENT: No fever, significant weight changes sweats headaches vision problems hearing changes, CV/ PULM; No chest pain  Some winded ness with vigorous activity uncertain if conditioning  cough, syncope,edema  change in exercise tolerance. GI /GU: No adominal pain, vomiting, change in bowel habits. No blood in the stool. No significant GU symptoms. SKIN/HEME: ,no acute skin rashes suspicious lesions or bleeding. No lymphadenopathy, nodules, masses.  NEURO/ PSYCH:  No neurologic signs such as weakness numbness. No depression anxiety. IMM/ Allergy: No unusual infections.  Allergy .   REST of 12 system review negative except as per HPI   Past Medical History  Diagnosis Date  . Allergy   . Hyperlipidemia   . Hypertension   . History of renal stone   . Hx of bacterial pneumonia     hosp  2001  . Hx of diverticulitis of colon     hosp 2001  . Prostate ca 10-24-11    7'13- bx. of prostate + cancer-surgery planned  . Prostate cancer 10/31/2011    Family History  Problem Relation Age of Onset  . Diabetes Mother   . Colon cancer Father   . Cancer Father     colon  . Diabetes Sister     borderline  . Diabetes Brother     borderline  . ADD / ADHD Son     History   Social  History  . Marital Status: Married    Spouse Name: N/A    Number of Children: N/A  . Years of Education: N/A   Social History Main Topics  . Smoking status: Never Smoker   . Smokeless tobacco: None  . Alcohol Use: Yes     Comment: very use 1-2 per yr  . Drug Use: No  . Sexual Activity: Not Currently   Other Topics Concern  . 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    Accountant   2 cats and a dog    Outpatient Encounter Prescriptions as of 05/14/2013  Medication Sig  . aspirin 81 MG tablet Take 81 mg by mouth daily.  Marland Kitchen ezetimibe-simvastatin (VYTORIN) 10-40 MG per tablet TAKE 1 TABLET AT BEDTIME  . fluticasone (FLONASE) 50 MCG/ACT nasal spray USE 2 SPRAYS NASALLY DAILY  . valsartan-hydrochlorothiazide (DIOVAN-HCT) 320-25 MG per tablet TAKE 1 TABLET DAILY  . [DISCONTINUED] fluticasone (FLONASE) 50 MCG/ACT nasal spray USE 2 SPRAYS NASALLY DAILY  . [DISCONTINUED] valsartan-hydrochlorothiazide (DIOVAN-HCT) 320-25 MG per tablet TAKE 1 TABLET DAILY  . [DISCONTINUED] VYTORIN 10-40 MG per tablet TAKE 1 TABLET AT BEDTIME    EXAM:  BP 124/80  Pulse 80  Temp(Src) 97.8 F (36.6 C) (Oral)  Ht 5\' 5"  (1.651 m)  Wt 240 lb (108.863 kg)  BMI 39.94 kg/m2  SpO2 98%  Body mass index is 39.94 kg/(m^2).  Physical Exam: Vital signs reviewed ZCH:YIFO is a well-developed well-nourished alert cooperative    who appearsr stated age in no acute distress.  HEENT: normocephalic atraumatic , Eyes: PERRL EOM's full, conjunctiva clear, Nares: paten,t no deformity discharge or tenderness., Ears: no deformity EAC's clear TMs with normal landmarks. Mouth: clear OP, no lesions, edema.  Moist mucous membranes. Dentition in adequate repair. NECK: supple without masses, thyromegaly or bruits. CHEST/PULM:  Clear to auscultation and percussion breath sounds equal no wheeze , rales or rhonchi. No chest wall deformities or tenderness. CV: PMI is nondisplaced, S1 S2 no gallops,  murmurs, rubs. Peripheral pulses are full without delay.No JVD .  ABDOMEN: Bowel sounds normal nontender  No guard or rebound, no hepato splenomegal no CVA tenderness.  No hernia. Extremtities:  No clubbing cyanosis or edema, no acute joint swelling or redness no focal atrophy NEURO:  Oriented x3, cranial nerves 3-12 appear to be intact, no obvious focal weakness,gait within normal limits no abnormal reflexes or asymmetrical SKIN: No acute rashes normal turgor, color, no bruising or petechiae. Multiple lesions sks  PSYCH: Oriented, good eye contact, no obvious depression anxiety, cognition and judgment appear normal. LN: no cervical axillary inguinal adenopathy  Lab Results  Component Value Date   WBC 7.1 05/07/2013   HGB 14.3 05/07/2013   HCT 42.6 05/07/2013   PLT 300.0 05/07/2013   GLUCOSE 101* 05/07/2013   CHOL 171 05/07/2013   TRIG 153.0* 05/07/2013   HDL 43.00 05/07/2013   LDLCALC 97 05/07/2013   ALT 26 05/07/2013   AST 21 05/07/2013   NA 141 05/07/2013   K 4.5 05/07/2013   CL 101 05/07/2013   CREATININE 1.1 05/07/2013   BUN 17 05/07/2013   CO2 31 05/07/2013   TSH 3.12 05/07/2013   PSA 0.00* 05/07/2013   HGBA1C 6.2 05/26/2008    ASSESSMENT AND PLAN:  Discussed the following assessment and plan:  Visit for preventive health examination  HYPERLIPIDEMIA  HYPERTENSION  Obesity (BMI 30-39.9)  Prostate cancer derm options    .   Patient Care Team: Burnis Medin, MD as PCP - General Inda Castle, MD (Gastroenterology) Patient Instructions  Weight loss may he;lp the  exercise tolerance  But if you get worsening and ot chest pain . Recheck   Check into shingles vaccine ( Zostavax) reimbursement or cost to you  and can return at any time if call ahead for injection. List of dermatologist  Ok to change .       Standley Brooking. Kennetha Pearman [name]

## 2013-05-14 NOTE — Patient Instructions (Addendum)
Weight loss may he;lp the  exercise tolerance  But if you get worsening and ot chest pain . Recheck   Check into shingles vaccine ( Zostavax) reimbursement or cost to you  and can return at any time if call ahead for injection. List of dermatologist  Ok to change .

## 2013-05-14 NOTE — Progress Notes (Signed)
Pre visit review using our clinic review tool, if applicable. No additional management support is needed unless otherwise documented below in the visit note. 

## 2013-05-17 ENCOUNTER — Telehealth: Payer: Self-pay | Admitting: Internal Medicine

## 2013-05-17 DIAGNOSIS — E785 Hyperlipidemia, unspecified: Secondary | ICD-10-CM

## 2013-05-17 NOTE — Telephone Encounter (Signed)
Relevant patient education mailed to patient.  

## 2013-05-17 NOTE — Telephone Encounter (Signed)
Vytorin was denied for pt.  Pt must try 2 of the following:  Atorvastatin, Simvastatin, Pravastatin or Lovastatin

## 2013-05-18 MED ORDER — ATORVASTATIN CALCIUM 20 MG PO TABS: 20.0000 mg | ORAL_TABLET | Freq: Every day | ORAL | Status: DC

## 2013-05-18 NOTE — Telephone Encounter (Signed)
Tell patient that his insurance doesn't want to cover med vytorin  unless he tries  At least 2 others on their list .  Can change to atorvastatin 20 mg  1 po qd disp 90 refill x 3   Recheck LIPID panel in 2 months ( no ov needed)

## 2013-05-18 NOTE — Telephone Encounter (Signed)
Spoke to the patient.  Agreed to stop Vytorin and try Lipitor.  Sent to the pharmacy.  Has also made future lab appt.

## 2013-07-19 ENCOUNTER — Other Ambulatory Visit (INDEPENDENT_AMBULATORY_CARE_PROVIDER_SITE_OTHER): Payer: BC Managed Care – PPO

## 2013-07-19 DIAGNOSIS — E785 Hyperlipidemia, unspecified: Secondary | ICD-10-CM

## 2013-07-19 LAB — LIPID PANEL
CHOLESTEROL: 166 mg/dL (ref 0–200)
HDL: 38.7 mg/dL — ABNORMAL LOW (ref >39.00)
LDL Cholesterol: 98 mg/dL (ref 0–99)
NonHDL: 127.3
Total CHOL/HDL Ratio: 4
Triglycerides: 146 mg/dL (ref 0.0–149.0)
VLDL: 29.2 mg/dL (ref 0.0–40.0)

## 2013-10-21 ENCOUNTER — Encounter: Payer: Self-pay | Admitting: Gastroenterology

## 2013-11-17 ENCOUNTER — Other Ambulatory Visit: Payer: Self-pay | Admitting: Internal Medicine

## 2013-11-18 NOTE — Telephone Encounter (Signed)
Lipitor filled for 1 year on 05/18/13.  That request was denied.  Flonase sent in for 90 days as that rx will be placed on hold for the future.

## 2014-02-17 ENCOUNTER — Other Ambulatory Visit: Payer: Self-pay | Admitting: Internal Medicine

## 2014-02-17 NOTE — Telephone Encounter (Signed)
Denied.  Refills too early.  Filled for 1 year in April 2015.

## 2014-05-06 ENCOUNTER — Other Ambulatory Visit (INDEPENDENT_AMBULATORY_CARE_PROVIDER_SITE_OTHER): Payer: BLUE CROSS/BLUE SHIELD

## 2014-05-06 DIAGNOSIS — Z0000 Encounter for general adult medical examination without abnormal findings: Secondary | ICD-10-CM

## 2014-05-06 LAB — CBC WITH DIFFERENTIAL/PLATELET
BASOS PCT: 0.5 % (ref 0.0–3.0)
Basophils Absolute: 0 10*3/uL (ref 0.0–0.1)
EOS PCT: 5.8 % — AB (ref 0.0–5.0)
Eosinophils Absolute: 0.4 10*3/uL (ref 0.0–0.7)
HCT: 42.2 % (ref 39.0–52.0)
Hemoglobin: 14.3 g/dL (ref 13.0–17.0)
LYMPHS ABS: 2 10*3/uL (ref 0.7–4.0)
Lymphocytes Relative: 25.9 % (ref 12.0–46.0)
MCHC: 34 g/dL (ref 30.0–36.0)
MCV: 84.9 fl (ref 78.0–100.0)
MONO ABS: 0.8 10*3/uL (ref 0.1–1.0)
MONOS PCT: 10.6 % (ref 3.0–12.0)
NEUTROS ABS: 4.3 10*3/uL (ref 1.4–7.7)
Neutrophils Relative %: 57.2 % (ref 43.0–77.0)
PLATELETS: 291 10*3/uL (ref 150.0–400.0)
RBC: 4.96 Mil/uL (ref 4.22–5.81)
RDW: 14.1 % (ref 11.5–15.5)
WBC: 7.5 10*3/uL (ref 4.0–10.5)

## 2014-05-06 LAB — HEPATIC FUNCTION PANEL
ALBUMIN: 3.9 g/dL (ref 3.5–5.2)
ALT: 22 U/L (ref 0–53)
AST: 17 U/L (ref 0–37)
Alkaline Phosphatase: 69 U/L (ref 39–117)
BILIRUBIN DIRECT: 0.1 mg/dL (ref 0.0–0.3)
Total Bilirubin: 0.5 mg/dL (ref 0.2–1.2)
Total Protein: 6.9 g/dL (ref 6.0–8.3)

## 2014-05-06 LAB — LIPID PANEL
CHOL/HDL RATIO: 4
Cholesterol: 166 mg/dL (ref 0–200)
HDL: 38.2 mg/dL — AB (ref >39.00)
LDL CALC: 100 mg/dL — AB (ref 0–99)
NonHDL: 127.8
Triglycerides: 139 mg/dL (ref 0.0–149.0)
VLDL: 27.8 mg/dL (ref 0.0–40.0)

## 2014-05-06 LAB — BASIC METABOLIC PANEL
BUN: 15 mg/dL (ref 6–23)
CO2: 33 mEq/L — ABNORMAL HIGH (ref 19–32)
Calcium: 9.5 mg/dL (ref 8.4–10.5)
Chloride: 99 mEq/L (ref 96–112)
Creatinine, Ser: 1.07 mg/dL (ref 0.40–1.50)
GFR: 73.85 mL/min (ref >60.00)
Glucose, Bld: 113 mg/dL — ABNORMAL HIGH (ref 70–99)
POTASSIUM: 4.3 meq/L (ref 3.5–5.1)
Sodium: 139 mEq/L (ref 135–145)

## 2014-05-06 LAB — TSH: TSH: 3.53 u[IU]/mL (ref 0.35–4.50)

## 2014-05-06 LAB — PSA: PSA: 0.01 ng/mL — AB (ref 0.10–4.00)

## 2014-05-09 ENCOUNTER — Other Ambulatory Visit: Payer: BC Managed Care – PPO

## 2014-05-15 ENCOUNTER — Other Ambulatory Visit: Payer: Self-pay | Admitting: Internal Medicine

## 2014-05-16 ENCOUNTER — Encounter: Payer: BC Managed Care – PPO | Admitting: Internal Medicine

## 2014-05-18 ENCOUNTER — Other Ambulatory Visit: Payer: Self-pay | Admitting: Internal Medicine

## 2014-05-18 NOTE — Telephone Encounter (Signed)
Sent to the pharmacy by e-scribe.  Pt has upcoming appt on 05/20/14

## 2014-05-20 ENCOUNTER — Ambulatory Visit (INDEPENDENT_AMBULATORY_CARE_PROVIDER_SITE_OTHER): Payer: BLUE CROSS/BLUE SHIELD | Admitting: Internal Medicine

## 2014-05-20 ENCOUNTER — Encounter: Payer: Self-pay | Admitting: Internal Medicine

## 2014-05-20 VITALS — BP 136/80 | Temp 98.6°F | Ht 64.75 in | Wt 240.5 lb

## 2014-05-20 DIAGNOSIS — Z0000 Encounter for general adult medical examination without abnormal findings: Secondary | ICD-10-CM | POA: Diagnosis not present

## 2014-05-20 DIAGNOSIS — Z79899 Other long term (current) drug therapy: Secondary | ICD-10-CM

## 2014-05-20 DIAGNOSIS — I10 Essential (primary) hypertension: Secondary | ICD-10-CM

## 2014-05-20 DIAGNOSIS — R7301 Impaired fasting glucose: Secondary | ICD-10-CM

## 2014-05-20 DIAGNOSIS — E785 Hyperlipidemia, unspecified: Secondary | ICD-10-CM

## 2014-05-20 DIAGNOSIS — Z8546 Personal history of malignant neoplasm of prostate: Secondary | ICD-10-CM

## 2014-05-20 MED ORDER — EZETIMIBE-SIMVASTATIN 10-40 MG PO TABS: ORAL_TABLET | ORAL | Status: DC

## 2014-05-20 MED ORDER — VALSARTAN-HYDROCHLOROTHIAZIDE 320-25 MG PO TABS: 1.0000 | ORAL_TABLET | Freq: Every day | ORAL | Status: DC

## 2014-05-20 NOTE — Patient Instructions (Signed)
Your blood sugar    Is rising  To the prediabetic range   Important life style changes  To avoid getting diabetes.  Plan  Hg a1c and fasting bmp  In 6 months .   And then ov  Can work in .       Why follow it? Research shows. . Those who follow the Mediterranean diet have a reduced risk of heart disease  . The diet is associated with a reduced incidence of Parkinson's and Alzheimer's diseases . People following the diet may have longer life expectancies and lower rates of chronic diseases  . The Dietary Guidelines for Americans recommends the Mediterranean diet as an eating plan to promote health and prevent disease  What Is the Mediterranean Diet?  . Healthy eating plan based on typical foods and recipes of Mediterranean-style cooking . The diet is primarily a plant based diet; these foods should make up a majority of meals   Starches - Plant based foods should make up a majority of meals - They are an important sources of vitamins, minerals, energy, antioxidants, and fiber - Choose whole grains, foods high in fiber and minimally processed items  - Typical grain sources include wheat, oats, barley, corn, brown rice, bulgar, farro, millet, polenta, couscous  - Various types of beans include chickpeas, lentils, fava beans, black beans, white beans   Fruits  Veggies - Large quantities of antioxidant rich fruits & veggies; 6 or more servings  - Vegetables can be eaten raw or lightly drizzled with oil and cooked  - Vegetables common to the traditional Mediterranean Diet include: artichokes, arugula, beets, broccoli, brussel sprouts, cabbage, carrots, celery, collard greens, cucumbers, eggplant, kale, leeks, lemons, lettuce, mushrooms, okra, onions, peas, peppers, potatoes, pumpkin, radishes, rutabaga, shallots, spinach, sweet potatoes, turnips, zucchini - Fruits common to the Mediterranean Diet include: apples, apricots, avocados, cherries, clementines, dates, figs, grapefruits, grapes, melons,  nectarines, oranges, peaches, pears, pomegranates, strawberries, tangerines  Fats - Replace butter and margarine with healthy oils, such as olive oil, canola oil, and tahini  - Limit nuts to no more than a handful a day  - Nuts include walnuts, almonds, pecans, pistachios, pine nuts  - Limit or avoid candied, honey roasted or heavily salted nuts - Olives are central to the [employer] - can be eaten whole or used in a variety of dishes   Meats Protein - Limiting red meat: no more than a few times a month - When eating red meat: choose lean cuts and keep the portion to the size of deck of cards - Eggs: approx. 0 to 4 times a week  - Fish and lean poultry: at least 2 a week  - Healthy protein sources include, chicken, [place], lean beef, lamb - Increase intake of seafood such as tuna, salmon, trout, mackerel, shrimp, scallops - Avoid or limit high fat processed meats such as sausage and bacon  Dairy - Include moderate amounts of low fat dairy products  - Focus on healthy dairy such as fat free yogurt, skim milk, low or reduced fat cheese - Limit dairy products higher in fat such as whole or 2% milk, cheese, ice cream  Alcohol - Moderate amounts of red wine is ok  - No more than 5 oz daily for women (all ages) and men older than age 20  - No more than 10 oz of wine daily for men younger than 24  Other - Limit sweets and other desserts  - Use herbs and spices instead  of salt to flavor foods  - Herbs and spices common to the traditional Mediterranean Diet include: basil, bay leaves, chives, cloves, cumin, fennel, garlic, lavender, marjoram, mint, oregano, parsley, pepper, rosemary, sage, savory, sumac, tarragon, thyme   It's not just a diet, it's a lifestyle:  . The Mediterranean diet includes lifestyle factors typical of those in the region  . Foods, drinks and meals are best eaten with others and savored . Daily physical activity is important for overall good health . This could be  strenuous exercise like running and aerobics . This could also be more leisurely activities such as walking, housework, yard-work, or taking the stairs . Moderation is the key; a balanced and healthy diet accommodates most foods and drinks . Consider portion sizes and frequency of consumption of certain foods   Meal Ideas & Options:  . Breakfast:  o Whole wheat toast or whole wheat English muffins with peanut butter & hard boiled egg o Steel cut oats topped with apples & cinnamon and skim milk  o Fresh fruit: banana, strawberries, melon, berries, peaches  o Smoothies: strawberries, bananas, greek yogurt, peanut butter o Low fat greek yogurt with blueberries and granola  o Egg white omelet with spinach and mushrooms o Breakfast couscous: whole wheat couscous, apricots, skim milk, cranberries  . Sandwiches:  o Hummus and grilled vegetables (peppers, zucchini, squash) on whole wheat bread   o Grilled chicken on whole wheat pita with lettuce, tomatoes, cucumbers or tzatziki  o Tuna salad on whole wheat bread: tuna salad made with greek yogurt, olives, red peppers, capers, green onions o Garlic rosemary lamb pita: lamb sauted with garlic, rosemary, salt & pepper; add lettuce, cucumber, greek yogurt to pita - flavor with lemon juice and black pepper  . Seafood:  o Mediterranean grilled salmon, seasoned with garlic, basil, parsley, lemon juice and black pepper o Shrimp, lemon, and spinach whole-grain pasta salad made with low fat greek yogurt  o Seared scallops with lemon orzo  o Seared tuna steaks seasoned salt, pepper, coriander topped with tomato mixture of olives, tomatoes, olive oil, minced garlic, parsley, green onions and cappers  . Meats:  o Herbed greek chicken salad with kalamata olives, cucumber, feta  o Red bell peppers stuffed with spinach, bulgur, lean ground beef (or lentils) & topped with feta   o Kebabs: skewers of chicken, tomatoes, onions, zucchini, squash  o [place] burgers:  made with red onions, mint, dill, lemon juice, feta cheese topped with roasted red peppers . Vegetarian o Cucumber salad: cucumbers, artichoke hearts, celery, red onion, feta cheese, tossed in olive oil & lemon juice  o Hummus and whole grain pita points with a greek salad (lettuce, tomato, feta, olives, cucumbers, red onion) o Lentil soup with celery, carrots made with vegetable broth, garlic, salt and pepper  o Tabouli salad: parsley, bulgur, mint, scallions, cucumbers, tomato, radishes, lemon juice, olive oil, salt and pepper.

## 2014-05-20 NOTE — Progress Notes (Signed)
Pre visit review using our clinic review tool, if applicable. No additional management support is needed unless otherwise documented below in the visit note.  Chief Complaint  Patient presents with  . Annual Exam    HPI: Patient  Daniel RAYGOZA Sr.  64 y.o. comes in today for Preventive Health Care visit  No major changes in health bp ok no injury . followed uro for prostate cancer doing ok.  Bp needs refill doing ok  liopid med taking  No bleeding  cv pulm neuro o sx.  No cp xob  Tends to like sweets and breads .   Health Maintenance  Topic Date Due  . ZOSTAVAX  11/28/2009  . HIV Screening  04/25/2015 (Originally 11/28/1965)  . INFLUENZA VACCINE  08/22/2014  . COLONOSCOPY  04/12/2017  . TETANUS/TDAP  05/13/2022   Health Maintenance Review LIFESTYLE:  Exercise:  Weight no aerobic Tobacco/ETS:no Alcohol: per day  Sugar beverages:small pepsi sweet tea when goes out Sleep: ? nosa Drug use: no Colonoscopy:  utd ROS:  GEN/ HEENT: No fever, significant weight changes sweats headaches vision problems hearing changes, CV/ PULM; No chest pain shortness of breath cough, syncope,edema  change in exercise tolerance. GI /GU: No adominal pain, vomiting, change in bowel habits. No blood in the stool. No significant GU symptoms. SKIN/HEME: ,no acute skin rashes suspicious lesions or bleeding. No lymphadenopathy, nodules, masses.  NEURO/ PSYCH:  No neurologic signs such as weakness numbness. No depression anxiety. IMM/ Allergy: No unusual infections.  Allergy .   REST of 12 system review negative except as per HPI   Past Medical History  Diagnosis Date  . Allergy   . Hyperlipidemia   . Hypertension   . History of renal stone   . Hx of bacterial pneumonia     hosp  2001  . Hx of diverticulitis of colon     hosp 2001  . Prostate ca 10-24-11    7'13- bx. of prostate + cancer-surgery planned  . Prostate cancer 10/31/2011    Past Surgical History  Procedure Laterality Date  .  Shoulder surgery      lt. arthroscopy  . Wisdom tooth extraction  10-24-11    x2 extracted  . Robot assisted laparoscopic radical prostatectomy  10/28/2011    Procedure: ROBOTIC ASSISTED LAPAROSCOPIC RADICAL PROSTATECTOMY;  Surgeon: Malka So, MD;  Location: [facility];  Service: Urology;  Laterality: N/A;    Family History  Problem Relation Age of Onset  . Diabetes Mother   . Colon cancer Father   . Cancer Father     colon  . Diabetes Sister     borderline  . Diabetes Brother     borderline  . ADD / ADHD Son     History   Social History  . Marital Status: Married    Spouse Name: N/A  . Number of Children: N/A  . Years of Education: N/A   Social History Main Topics  . Smoking status: Never Smoker   . Smokeless tobacco: Not on file  . Alcohol Use: Yes     Comment: very use 1-2 per yr  . Drug Use: No  . Sexual Activity: Not Currently   Other Topics Concern  . 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    Accountant   2 cats and a dog    Outpatient Encounter Prescriptions as of 05/20/2014  Medication Sig  . aspirin 81 MG tablet  Take 81 mg by mouth daily.  Marland Kitchen ezetimibe-simvastatin (VYTORIN) 10-40 MG per tablet TAKE 1 TABLET AT BEDTIME  . fluticasone (FLONASE) 50 MCG/ACT nasal spray USE TWO SPRAYS IN EACH NOSTRIL EVERY DAY  . valsartan-hydrochlorothiazide (DIOVAN-HCT) 320-25 MG per tablet Take 1 tablet by mouth daily.  . [DISCONTINUED] ezetimibe-simvastatin (VYTORIN) 10-40 MG per tablet TAKE 1 TABLET AT BEDTIME  . [DISCONTINUED] valsartan-hydrochlorothiazide (DIOVAN-HCT) 320-25 MG per tablet TAKE ONE TABLET BY MOUTH EVERY DAY  . [DISCONTINUED] atorvastatin (LIPITOR) 20 MG tablet TAKE ONE TABLET BY MOUTH EVERY DAY    EXAM:  BP 136/80 mmHg  Temp(Src) 98.6 F (37 C) (Oral)  Ht 5' 4.75" (1.645 m)  Wt 240 lb 8 oz (109.09 kg)  BMI 40.31 kg/m2  Body mass index is 40.31 kg/(m^2).  Physical Exam: Vital signs reviewed OEH:OZYY is a  well-developed well-nourished alert cooperative    who appearsr stated age in no acute distress.  HEENT: normocephalic atraumatic , Eyes: PERRL EOM's full, conjunctiva clear, Nares: paten,t no deformity discharge or tenderness., Ears: no deformity EAC's clear  glassessTMs with normal landmarks. Mouth: clear OP, no lesions, edema.  Moist mucous membranes. Dentition in adequate repair. NECK: supple without masses, thyromegaly or bruits. CHEST/PULM:  Clear to auscultation and percussion breath sounds equal no wheeze , rales or rhonchi. No chest wall deformities or tenderness. CV: PMI is nondisplaced, S1 S2 no gallops, murmurs, rubs. Peripheral pulses are full without delay.No JVD .  ABDOMEN: Bowel sounds normal nontender  No guard or rebound, no hepato splenomegal no CVA tenderness.   Extremtities:  No clubbing cyanosis or edema, no acute joint swelling or redness no focal atrophy NEURO:  Oriented x3, cranial nerves 3-12 appear to be intact, no obvious focal weakness,gait within normal limits no abnormal reflexes or asymmetrical SKIN: No acute rashes normal turgor, color, no bruising or petechiae. Lipoma right upper back about 5-6 cm non tender  Moles and keratosis  PSYCH: Oriented, good eye contact, no obvious depression anxiety, cognition and judgment appear normal. LN: no cervical axillary l adenopathy  Lab Results  Component Value Date   WBC 7.5 05/06/2014   HGB 14.3 05/06/2014   HCT 42.2 05/06/2014   PLT 291.0 05/06/2014   GLUCOSE 113* 05/06/2014   CHOL 166 05/06/2014   TRIG 139.0 05/06/2014   HDL 38.20* 05/06/2014   LDLCALC 100* 05/06/2014   ALT 22 05/06/2014   AST 17 05/06/2014   NA 139 05/06/2014   K 4.3 05/06/2014   CL 99 05/06/2014   CREATININE 1.07 05/06/2014   BUN 15 05/06/2014   CO2 33* 05/06/2014   TSH 3.53 05/06/2014   PSA 0.01* 05/06/2014   HGBA1C 6.2 05/26/2008   BP Readings from Last 3 Encounters:  05/20/14 136/80  05/14/13 124/80  11/03/12 130/78   Wt Readings  from Last 3 Encounters:  05/20/14 240 lb 8 oz (109.09 kg)  05/14/13 240 lb (108.863 kg)  11/03/12 242 lb (109.77 kg)     ASSESSMENT AND PLAN:  Discussed the following assessment and plan:  Visit for preventive health examination  Medication management  Essential hypertension - controlled   Fasting hyperglycemia - dsic about risk of dm and counseled  lsi really inprotant offered counseling  a1c and bmp in 6 months if ok then yearly   Hyperlipidemia - continue  Hx of prostatic malignancy - dr Jeffie Pollock yearly   Patient Care Team: Burnis Medin, MD as PCP - General Inda Castle, MD (Gastroenterology) Patient Instructions   Your blood sugar  Is rising  To the prediabetic range   Important life style changes  To avoid getting diabetes.  Plan  Hg a1c and fasting bmp  In 6 months .   And then ov  Can work in .       Why follow it? Research shows. . Those who follow the Mediterranean diet have a reduced risk of heart disease  . The diet is associated with a reduced incidence of Parkinson's and Alzheimer's diseases . People following the diet may have longer life expectancies and lower rates of chronic diseases  . The Dietary Guidelines for Americans recommends the Mediterranean diet as an eating plan to promote health and prevent disease  What Is the Mediterranean Diet?  . Healthy eating plan based on typical foods and recipes of Mediterranean-style cooking . The diet is primarily a plant based diet; these foods should make up a majority of meals   Starches - Plant based foods should make up a majority of meals - They are an important sources of vitamins, minerals, energy, antioxidants, and fiber - Choose whole grains, foods high in fiber and minimally processed items  - Typical grain sources include wheat, oats, barley, corn, brown rice, bulgar, farro, millet, polenta, couscous  - Various types of beans include chickpeas, lentils, fava beans, black beans, white beans     Fruits  Veggies - Large quantities of antioxidant rich fruits & veggies; 6 or more servings  - Vegetables can be eaten raw or lightly drizzled with oil and cooked  - Vegetables common to the traditional Mediterranean Diet include: artichokes, arugula, beets, broccoli, brussel sprouts, cabbage, carrots, celery, collard greens, cucumbers, eggplant, kale, leeks, lemons, lettuce, mushrooms, okra, onions, peas, peppers, potatoes, pumpkin, radishes, rutabaga, shallots, spinach, sweet potatoes, turnips, zucchini - Fruits common to the Mediterranean Diet include: apples, apricots, avocados, cherries, clementines, dates, figs, grapefruits, grapes, melons, nectarines, oranges, peaches, pears, pomegranates, strawberries, tangerines  Fats - Replace butter and margarine with healthy oils, such as olive oil, canola oil, and tahini  - Limit nuts to no more than a handful a day  - Nuts include walnuts, almonds, pecans, pistachios, pine nuts  - Limit or avoid candied, honey roasted or heavily salted nuts - Olives are central to the [employer] - can be eaten whole or used in a variety of dishes   Meats Protein - Limiting red meat: no more than a few times a month - When eating red meat: choose lean cuts and keep the portion to the size of deck of cards - Eggs: approx. 0 to 4 times a week  - Fish and lean poultry: at least 2 a week  - Healthy protein sources include, chicken, [place], lean beef, lamb - Increase intake of seafood such as tuna, salmon, trout, mackerel, shrimp, scallops - Avoid or limit high fat processed meats such as sausage and bacon  Dairy - Include moderate amounts of low fat dairy products  - Focus on healthy dairy such as fat free yogurt, skim milk, low or reduced fat cheese - Limit dairy products higher in fat such as whole or 2% milk, cheese, ice cream  Alcohol - Moderate amounts of red wine is ok  - No more than 5 oz daily for women (all ages) and men older than age 64  - No more  than 10 oz of wine daily for men younger than 4  Other - Limit sweets and other desserts  - Use herbs and spices instead of salt to flavor  foods  - Herbs and spices common to the traditional Mediterranean Diet include: basil, bay leaves, chives, cloves, cumin, fennel, garlic, lavender, marjoram, mint, oregano, parsley, pepper, rosemary, sage, savory, sumac, tarragon, thyme   It's not just a diet, it's a lifestyle:  . The Mediterranean diet includes lifestyle factors typical of those in the region  . Foods, drinks and meals are best eaten with others and savored . Daily physical activity is important for overall good health . This could be strenuous exercise like running and aerobics . This could also be more leisurely activities such as walking, housework, yard-work, or taking the stairs . Moderation is the key; a balanced and healthy diet accommodates most foods and drinks . Consider portion sizes and frequency of consumption of certain foods   Meal Ideas & Options:  . Breakfast:  o Whole wheat toast or whole wheat English muffins with peanut butter & hard boiled egg o Steel cut oats topped with apples & cinnamon and skim milk  o Fresh fruit: banana, strawberries, melon, berries, peaches  o Smoothies: strawberries, bananas, greek yogurt, peanut butter o Low fat greek yogurt with blueberries and granola  o Egg white omelet with spinach and mushrooms o Breakfast couscous: whole wheat couscous, apricots, skim milk, cranberries  . Sandwiches:  o Hummus and grilled vegetables (peppers, zucchini, squash) on whole wheat bread   o Grilled chicken on whole wheat pita with lettuce, tomatoes, cucumbers or tzatziki  o Tuna salad on whole wheat bread: tuna salad made with greek yogurt, olives, red peppers, capers, green onions o Garlic rosemary lamb pita: lamb sauted with garlic, rosemary, salt & pepper; add lettuce, cucumber, greek yogurt to pita - flavor with lemon juice and black pepper   . Seafood:  o Mediterranean grilled salmon, seasoned with garlic, basil, parsley, lemon juice and black pepper o Shrimp, lemon, and spinach whole-grain pasta salad made with low fat greek yogurt  o Seared scallops with lemon orzo  o Seared tuna steaks seasoned salt, pepper, coriander topped with tomato mixture of olives, tomatoes, olive oil, minced garlic, parsley, green onions and cappers  . Meats:  o Herbed greek chicken salad with kalamata olives, cucumber, feta  o Red bell peppers stuffed with spinach, bulgur, lean ground beef (or lentils) & topped with feta   o Kebabs: skewers of chicken, tomatoes, onions, zucchini, squash  o [place] burgers: made with red onions, mint, dill, lemon juice, feta cheese topped with roasted red peppers . Vegetarian o Cucumber salad: cucumbers, artichoke hearts, celery, red onion, feta cheese, tossed in olive oil & lemon juice  o Hummus and whole grain pita points with a greek salad (lettuce, tomato, feta, olives, cucumbers, red onion) o Lentil soup with celery, carrots made with vegetable broth, garlic, salt and pepper  o Tabouli salad: parsley, bulgur, mint, scallions, cucumbers, tomato, radishes, lemon juice, olive oil, salt and pepper.         Standley Brooking. Panosh [name]

## 2014-08-15 ENCOUNTER — Other Ambulatory Visit: Payer: Self-pay | Admitting: Internal Medicine

## 2014-08-15 NOTE — Telephone Encounter (Signed)
Sent to the pharmacy by e-scribe. 

## 2014-11-17 ENCOUNTER — Other Ambulatory Visit: Payer: Self-pay | Admitting: Family Medicine

## 2014-11-17 DIAGNOSIS — I10 Essential (primary) hypertension: Secondary | ICD-10-CM

## 2014-11-17 DIAGNOSIS — R739 Hyperglycemia, unspecified: Secondary | ICD-10-CM

## 2014-11-18 ENCOUNTER — Other Ambulatory Visit (INDEPENDENT_AMBULATORY_CARE_PROVIDER_SITE_OTHER): Payer: BLUE CROSS/BLUE SHIELD

## 2014-11-18 DIAGNOSIS — R739 Hyperglycemia, unspecified: Secondary | ICD-10-CM | POA: Diagnosis not present

## 2014-11-18 DIAGNOSIS — I10 Essential (primary) hypertension: Secondary | ICD-10-CM

## 2014-11-18 LAB — BASIC METABOLIC PANEL
BUN: 18 mg/dL (ref 6–23)
CO2: 30 meq/L (ref 19–32)
Calcium: 9.6 mg/dL (ref 8.4–10.5)
Chloride: 102 mEq/L (ref 96–112)
Creatinine, Ser: 1.14 mg/dL (ref 0.40–1.50)
GFR: 68.53 mL/min (ref >60.00)
Glucose, Bld: 114 mg/dL — ABNORMAL HIGH (ref 70–99)
POTASSIUM: 4.4 meq/L (ref 3.5–5.1)
Sodium: 142 mEq/L (ref 135–145)

## 2014-11-18 LAB — HEMOGLOBIN A1C: HEMOGLOBIN A1C: 6.1 % (ref 4.6–6.5)

## 2014-11-24 ENCOUNTER — Other Ambulatory Visit: Payer: Self-pay | Admitting: Family Medicine

## 2014-11-24 DIAGNOSIS — R739 Hyperglycemia, unspecified: Secondary | ICD-10-CM

## 2014-11-24 DIAGNOSIS — Z0000 Encounter for general adult medical examination without abnormal findings: Secondary | ICD-10-CM

## 2015-01-30 ENCOUNTER — Other Ambulatory Visit: Payer: Self-pay | Admitting: Internal Medicine

## 2015-04-27 ENCOUNTER — Other Ambulatory Visit: Payer: Self-pay | Admitting: Internal Medicine

## 2015-04-27 NOTE — Telephone Encounter (Signed)
Sent to the pharmacy by e-scribe.  Pt has upcoming appt on 05/29/15

## 2015-05-23 ENCOUNTER — Other Ambulatory Visit (INDEPENDENT_AMBULATORY_CARE_PROVIDER_SITE_OTHER): Payer: BLUE CROSS/BLUE SHIELD

## 2015-05-23 DIAGNOSIS — R739 Hyperglycemia, unspecified: Secondary | ICD-10-CM | POA: Diagnosis not present

## 2015-05-23 DIAGNOSIS — Z0000 Encounter for general adult medical examination without abnormal findings: Secondary | ICD-10-CM

## 2015-05-23 LAB — CBC WITH DIFFERENTIAL/PLATELET
BASOS PCT: 0.7 % (ref 0.0–3.0)
Basophils Absolute: 0 10*3/uL (ref 0.0–0.1)
EOS ABS: 0.4 10*3/uL (ref 0.0–0.7)
EOS PCT: 6.6 % — AB (ref 0.0–5.0)
HCT: 41.5 % (ref 39.0–52.0)
HEMOGLOBIN: 14.1 g/dL (ref 13.0–17.0)
LYMPHS PCT: 24 % (ref 12.0–46.0)
Lymphs Abs: 1.4 10*3/uL (ref 0.7–4.0)
MCHC: 34 g/dL (ref 30.0–36.0)
MCV: 85.6 fl (ref 78.0–100.0)
MONO ABS: 0.6 10*3/uL (ref 0.1–1.0)
Monocytes Relative: 9.9 % (ref 3.0–12.0)
NEUTROS ABS: 3.5 10*3/uL (ref 1.4–7.7)
NEUTROS PCT: 58.8 % (ref 43.0–77.0)
Platelets: 278 10*3/uL (ref 150.0–400.0)
RBC: 4.85 Mil/uL (ref 4.22–5.81)
RDW: 14 % (ref 11.5–15.5)
WBC: 6 10*3/uL (ref 4.0–10.5)

## 2015-05-23 LAB — BASIC METABOLIC PANEL
BUN: 15 mg/dL (ref 6–23)
CHLORIDE: 100 meq/L (ref 96–112)
CO2: 32 meq/L (ref 19–32)
CREATININE: 1.06 mg/dL (ref 0.40–1.50)
Calcium: 9.5 mg/dL (ref 8.4–10.5)
GFR: 74.41 mL/min (ref >60.00)
Glucose, Bld: 110 mg/dL — ABNORMAL HIGH (ref 70–99)
Potassium: 4.1 mEq/L (ref 3.5–5.1)
Sodium: 140 mEq/L (ref 135–145)

## 2015-05-23 LAB — LIPID PANEL
CHOLESTEROL: 156 mg/dL (ref 0–200)
HDL: 36 mg/dL — AB (ref >39.00)
LDL Cholesterol: 98 mg/dL (ref 0–99)
NONHDL: 119.63
Total CHOL/HDL Ratio: 4
Triglycerides: 108 mg/dL (ref 0.0–149.0)
VLDL: 21.6 mg/dL (ref 0.0–40.0)

## 2015-05-23 LAB — HEMOGLOBIN A1C: HEMOGLOBIN A1C: 6.4 % (ref 4.6–6.5)

## 2015-05-23 LAB — HEPATIC FUNCTION PANEL
ALT: 22 U/L (ref 0–53)
AST: 21 U/L (ref 0–37)
Albumin: 4 g/dL (ref 3.5–5.2)
Alkaline Phosphatase: 71 U/L (ref 39–117)
BILIRUBIN TOTAL: 0.5 mg/dL (ref 0.2–1.2)
Bilirubin, Direct: 0.1 mg/dL (ref 0.0–0.3)
TOTAL PROTEIN: 7.1 g/dL (ref 6.0–8.3)

## 2015-05-23 LAB — PSA: PSA: 0.01 ng/mL — AB (ref 0.10–4.00)

## 2015-05-23 LAB — TSH: TSH: 2.71 u[IU]/mL (ref 0.35–4.50)

## 2015-05-28 NOTE — Progress Notes (Signed)
Chief Complaint  Patient presents with  . Annual Exam    HPI: Patient  Daniel HANNASCH Sr.  65 y.o. comes in today for Preventive Health Care visit  AND Chronic disease management  Bp ok  No se of med .Marland Kitchen Going to gym lost some weight  Lipid med atorva now  Changed  Still working ft needs refills get yearly skin check no new dx  Cute down on sweets.     Health Maintenance  Topic Date Due  . Hepatitis C Screening  May 05, 1950  . HIV Screening  11/28/1964  . ZOSTAVAX  11/28/2009  . PNA vac Low Risk Adult (1 of 2 - PCV13) 05/28/2016 (Originally 11/29/2014)  . INFLUENZA VACCINE  08/22/2015  . COLONOSCOPY  04/12/2017  . TETANUS/TDAP  05/13/2022   Health Maintenance Review LIFESTYLE:  Exercise:   Walking every day goes to gym  4 days per  Week weights .  Tobacco/ETS: NO Alcohol: 2-3 A YEAR  Sugar beverages: cut  Down more water not daily . Sugar in coffee.  Sleep:  About  Sleep  8- 9   Drug use: no    ROS:  GEN/ HEENT: No fever, significant weight changes sweats headaches vision problems hearing changes, CV/ PULM; No chest pain shortness of breath cough, syncope,edema  change in exercise tolerance. GI /GU: No adominal pain, vomiting, change in bowel habits. No blood in the stool. No significant GU symptoms. SKIN/HEME: ,no acute skin rashes suspicious lesions or bleeding. No lymphadenopathy, nodules, masses.  NEURO/ PSYCH:  No neurologic signs such as weakness numbness. No depression anxiety. IMM/ Allergy: No unusual infections.  Allergy .   REST of 12 system review negative except as per HPI   Past Medical History  Diagnosis Date  . Allergy   . Hyperlipidemia   . Hypertension   . History of renal stone   . Hx of bacterial pneumonia     hosp  2001  . Hx of diverticulitis of colon     hosp 2001  . Prostate CA ([place]) 10-24-11    7'13- bx. of prostate + cancer-surgery planned  . Prostate cancer ([place]) 10/31/2011    Past Surgical History  Procedure Laterality Date    . Shoulder surgery      lt. arthroscopy  . Wisdom tooth extraction  10-24-11    x2 extracted  . Robot assisted laparoscopic radical prostatectomy  10/28/2011    Procedure: ROBOTIC ASSISTED LAPAROSCOPIC RADICAL PROSTATECTOMY;  Surgeon: Malka So, MD;  Location: [facility];  Service: Urology;  Laterality: N/A;    Family History  Problem Relation Age of Onset  . Diabetes Mother   . Colon cancer Father   . Cancer Father     colon  . Diabetes Sister     borderline  . Diabetes Brother     borderline  . ADD / ADHD Son     Social History   Social History  . Marital Status: Married    Spouse Name: N/A  . Number of Children: N/A  . Years of Education: N/A   Social History Main Topics  . Smoking status: Never Smoker   . Smokeless tobacco: None  . Alcohol Use: Yes     Comment: very use 1-2 per yr  . Drug Use: No  . Sexual Activity: Not Currently   Other Topics Concern  . 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    Accountant  2 cats and a dog    Outpatient Prescriptions Prior to Visit  Medication Sig Dispense Refill  . aspirin 81 MG tablet Take 81 mg by mouth daily.    Marland Kitchen ezetimibe-simvastatin (VYTORIN) 10-40 MG per tablet TAKE 1 TABLET AT BEDTIME 90 tablet 3  . atorvastatin (LIPITOR) 20 MG tablet TAKE 1 TABLET BY MOUTH EVERY DAY 90 tablet 0  . fluticasone (FLONASE) 50 MCG/ACT nasal spray USE TWO SPRAYS IN EACH NOSTRIL EVERY DAY 48 g 0  . valsartan-hydrochlorothiazide (DIOVAN-HCT) 320-25 MG tablet TAKE 1 TABLET BY MOUTH DAILY 90 tablet 0   No facility-administered medications prior to visit.     EXAM:  BP 112/68 mmHg  Pulse 60  Temp(Src) 99.1 F (37.3 C) (Oral)  Ht 5\' 5"  (1.651 m)  Wt 228 lb (103.42 kg)  BMI 37.94 kg/m2  SpO2 98%  Body mass index is 37.94 kg/(m^2).  Physical Exam: Vital signs reviewed [id number] is a well-developed well-nourished alert cooperative    who appearsr stated age in no acute distress.  HEENT:  normocephalic atraumatic , Eyes: PERRL EOM's full, conjunctiva clear, Nares: paten,t no deformity discharge or tenderness., Ears: no deformity EAC's clear TMs with normal landmarks. Mouth: clear OP, no lesions, edema.  Moist mucous membranes. Dentition in adequate repair. NECK: supple without masses, thyromegaly or bruits. CHEST/PULM:  Clear to auscultation and percussion breath sounds equal no wheeze , rales or rhonchi. No chest wall deformities or tenderness. CV: PMI is nondisplaced, S1 S2 no gallops, murmurs, rubs. Peripheral pulses are full without delay.No JVD .  ABDOMEN: Bowel sounds normal nontender  No guard or rebound, no hepato splenomegal no CVA tenderness.  No hernia. Extremtities:  No clubbing cyanosis or edema, no acute joint swelling or redness no focal atrophy NEURO:  Oriented x3, cranial nerves 3-12 appear to be intact, no obvious focal weakness,gait within normal limits no abnormal reflexes or asymmetrical SKIN: No acute rashes normal turgor, color, no bruising or petechiae. Multiple moles and sks  PSYCH: Oriented, good eye contact, no obvious depression anxiety, cognition and judgment appear normal. LN: no cervical axillary inguinal adenopathy  Lab Results  Component Value Date   WBC 6.0 05/23/2015   HGB 14.1 05/23/2015   HCT 41.5 05/23/2015   PLT 278.0 05/23/2015   GLUCOSE 110* 05/23/2015   CHOL 156 05/23/2015   TRIG 108.0 05/23/2015   HDL 36.00* 05/23/2015   LDLCALC 98 05/23/2015   ALT 22 05/23/2015   AST 21 05/23/2015   NA 140 05/23/2015   K 4.1 05/23/2015   CL 100 05/23/2015   CREATININE 1.06 05/23/2015   BUN 15 05/23/2015   CO2 32 05/23/2015   TSH 2.71 05/23/2015   PSA 0.01* 05/23/2015   HGBA1C 6.4 05/23/2015   Wt Readings from Last 3 Encounters:  05/29/15 228 lb (103.42 kg)  05/20/14 240 lb 8 oz (109.09 kg)  05/14/13 240 lb (108.863 kg)     ASSESSMENT AND PLAN:  Discussed the following assessment and plan:  Visit for preventive health  examination  Medication management  Fasting hyperglycemia - disc decline metformin will continue lsi rov 6 mos a1c at visit  Hyperlipidemia - tg better lsi ldl at goal  Essential hypertension  Obesity (BMI 30-39.9)  History of prostate cancer - 4 years outt psa stable  Patient Care Team: Burnis Medin, MD as PCP - General Inda Castle, MD (Gastroenterology) Patient Instructions  Continue lifestyle intervention healthy eating and exercise .  And  healthy weight  Avoid  limited  breads and processed carbs ... Your sugar levels are in the prediabetic range . Let us know if you want to see nutritionist or try adding metformin medicaiton . But  Seems like doing ok .   6 months check  Will do hg a1c at that visit    Health Maintenance, Male A healthy lifestyle and preventative care can promote health and wellness.  Maintain regular health, dental, and eye exams.  Eat a healthy diet. Foods like vegetables, fruits, whole grains, low-fat dairy products, and lean protein foods contain the nutrients you need and are low in calories. Decrease your intake of foods high in solid fats, added sugars, and salt. Get information about a proper diet from your health care provider, if necessary.  Regular physical exercise is one of the most important things you can do for your health. Most adults should get at least 150 minutes of moderate-intensity exercise (any activity that increases your heart rate and causes you to sweat) each week. In addition, most adults need muscle-strengthening exercises on 2 or more days a week.   Maintain a healthy weight. The body mass index (BMI) is a screening tool to identify possible weight problems. It provides an estimate of body fat based on height and weight. Your health care provider can find your BMI and can help you achieve or maintain a healthy weight. For males 20 years and older:  A BMI below 18.5 is considered underweight.  A BMI of 18.5 to 24.9 is  normal.  A BMI of 25 to 29.9 is considered overweight.  A BMI of 30 and above is considered obese.  Maintain normal blood lipids and cholesterol by exercising and minimizing your intake of saturated fat. Eat a balanced diet with plenty of fruits and vegetables. Blood tests for lipids and cholesterol should begin at age 2 and be repeated every 5 years. If your lipid or cholesterol levels are high, you are over age 20, or you are at high risk for heart disease, you may need your cholesterol levels checked more frequently.Ongoing high lipid and cholesterol levels should be treated with medicines if diet and exercise are not working.  If you smoke, find out from your health care provider how to quit. If you do not use tobacco, do not start.  Lung cancer screening is recommended for adults aged 93-80 years who are at high risk for developing lung cancer because of a history of smoking. A yearly low-dose CT scan of the lungs is recommended for people who have at least a 30-pack-year history of smoking and are current smokers or have quit within the past 15 years. A pack year of smoking is smoking an average of 1 pack of cigarettes a day for 1 year (for example, a 30-pack-year history of smoking could mean smoking 1 pack a day for 30 years or 2 packs a day for 15 years). Yearly screening should continue until the smoker has stopped smoking for at least 15 years. Yearly screening should be stopped for people who develop a health problem that would prevent them from having lung cancer treatment.  If you choose to drink alcohol, do not have more than 2 drinks per day. One drink is considered to be 12 oz (360 mL) of beer, 5 oz (150 mL) of wine, or 1.5 oz (45 mL) of liquor.  Avoid the use of street drugs. Do not share needles with anyone. Ask for help if you need support or instructions about stopping the use of drugs.  High blood pressure causes heart disease and increases the risk of stroke. High blood  pressure is more likely to develop in:  People who have blood pressure in the end of the normal range (100-139/85-89 mm Hg).  People who are overweight or obese.  People who are African American.  If you are 17-14 years of age, have your blood pressure checked every 3-5 years. If you are 71 years of age or older, have your blood pressure checked every year. You should have your blood pressure measured twice--once when you are at a hospital or clinic, and once when you are not at a hospital or clinic. Record the average of the two measurements. To check your blood pressure when you are not at a hospital or clinic, you can use:  An automated blood pressure machine at a pharmacy.  A home blood pressure monitor.  If you are 65-80 years old, ask your health care provider if you should take aspirin to prevent heart disease.  Diabetes screening involves taking a blood sample to check your fasting blood sugar level. This should be done once every 3 years after age 73 if you are at a normal weight and without risk factors for diabetes. Testing should be considered at a younger age or be carried out more frequently if you are overweight and have at least 1 risk factor for diabetes.  Colorectal cancer can be detected and often prevented. Most routine colorectal cancer screening begins at the age of 10 and continues through age 17. However, your health care provider may recommend screening at an earlier age if you have risk factors for colon cancer. On a yearly basis, your health care provider may provide home test kits to check for hidden blood in the stool. A small camera at the end of a tube may be used to directly examine the colon (sigmoidoscopy or colonoscopy) to detect the earliest forms of colorectal cancer. Talk to your health care provider about this at age 64 when routine screening begins. A direct exam of the colon should be repeated every 5-10 years through age 64, unless early forms of  precancerous polyps or small growths are found.  People who are at an increased risk for hepatitis B should be screened for this virus. You are considered at high risk for hepatitis B if:  You were born in a country where hepatitis B occurs often. Talk with your health care provider about which countries are considered high risk.  Your parents were born in a high-risk country and you have not received a shot to protect against hepatitis B (hepatitis B vaccine).  You have HIV or AIDS.  You use needles to inject street drugs.  You live with, or have sex with, someone who has hepatitis B.  You are a man who has sex with other men (MSM).  You get hemodialysis treatment.  You take certain medicines for conditions like cancer, organ transplantation, and autoimmune conditions.  Hepatitis C blood testing is recommended for all people born from [birth year] through 1966 and any individual with known risk factors for hepatitis C.  Healthy men should no longer receive prostate-specific antigen (PSA) blood tests as part of routine cancer screening. Talk to your health care provider about prostate cancer screening.  Testicular cancer screening is not recommended for adolescents or adult males who have no symptoms. Screening includes self-exam, a health care provider exam, and other screening tests. Consult with your health care provider about any symptoms you have or any  concerns you have about testicular cancer.  Practice safe sex. Use condoms and avoid high-risk sexual practices to reduce the spread of sexually transmitted infections (STIs).  You should be screened for STIs, including gonorrhea and chlamydia if:  You are sexually active and are younger than 24 years.  You are older than 24 years, and your health care provider tells you that you are at risk for this type of infection.  Your sexual activity has changed since you were last screened, and you are at an increased risk for chlamydia or  gonorrhea. Ask your health care provider if you are at risk.  If you are at risk of being infected with HIV, it is recommended that you take a prescription medicine daily to prevent HIV infection. This is called pre-exposure prophylaxis (PrEP). You are considered at risk if:  You are a man who has sex with other men (MSM).  You are a heterosexual man who is sexually active with multiple partners.  You take drugs by injection.  You are sexually active with a partner who has HIV.  Talk with your health care provider about whether you are at high risk of being infected with HIV. If you choose to begin PrEP, you should first be tested for HIV. You should then be tested every 3 months for as long as you are taking PrEP.  Use sunscreen. Apply sunscreen liberally and repeatedly throughout the day. You should seek shade when your shadow is shorter than you. Protect yourself by wearing long sleeves, pants, a wide-brimmed hat, and sunglasses year round whenever you are outdoors.  Tell your health care provider of new moles or changes in moles, especially if there is a change in shape or color. Also, tell your health care provider if a mole is larger than the size of a pencil eraser.  A one-time screening for abdominal aortic aneurysm (AAA) and surgical repair of large AAAs by ultrasound is recommended for men aged 84-75 years who are current or former smokers.  Stay current with your vaccines (immunizations).   This information is not intended to replace advice given to you by your health care provider. Make sure you discuss any questions you have with your health care provider.   Document Released: 07/06/2007 Document Revised: 01/28/2014 Document Reviewed: 06/04/2010 Elsevier Interactive Patient Education 2016 Daniel K. Daniel Randall [name]

## 2015-05-29 ENCOUNTER — Ambulatory Visit (INDEPENDENT_AMBULATORY_CARE_PROVIDER_SITE_OTHER): Payer: BLUE CROSS/BLUE SHIELD | Admitting: Internal Medicine

## 2015-05-29 ENCOUNTER — Other Ambulatory Visit: Payer: Self-pay | Admitting: Internal Medicine

## 2015-05-29 ENCOUNTER — Encounter: Payer: Self-pay | Admitting: Internal Medicine

## 2015-05-29 VITALS — BP 112/68 | HR 60 | Temp 99.1°F | Ht 65.0 in | Wt 228.0 lb

## 2015-05-29 DIAGNOSIS — R7301 Impaired fasting glucose: Secondary | ICD-10-CM | POA: Diagnosis not present

## 2015-05-29 DIAGNOSIS — E669 Obesity, unspecified: Secondary | ICD-10-CM

## 2015-05-29 DIAGNOSIS — Z8546 Personal history of malignant neoplasm of prostate: Secondary | ICD-10-CM

## 2015-05-29 DIAGNOSIS — I10 Essential (primary) hypertension: Secondary | ICD-10-CM

## 2015-05-29 DIAGNOSIS — Z79899 Other long term (current) drug therapy: Secondary | ICD-10-CM

## 2015-05-29 DIAGNOSIS — Z0000 Encounter for general adult medical examination without abnormal findings: Secondary | ICD-10-CM

## 2015-05-29 DIAGNOSIS — E785 Hyperlipidemia, unspecified: Secondary | ICD-10-CM

## 2015-05-29 MED ORDER — FLUTICASONE PROPIONATE 50 MCG/ACT NA SUSP: 2.0000 | Freq: Every day | NASAL | Status: DC

## 2015-05-29 MED ORDER — VALSARTAN-HYDROCHLOROTHIAZIDE 320-25 MG PO TABS: 1.0000 | ORAL_TABLET | Freq: Every day | ORAL | Status: DC

## 2015-05-29 MED ORDER — ATORVASTATIN CALCIUM 20 MG PO TABS
20.0000 mg | ORAL_TABLET | Freq: Every day | ORAL | Status: DC
Start: 2015-05-29 — End: 2016-05-20

## 2015-05-29 NOTE — Progress Notes (Signed)
Pre visit review using our clinic review tool, if applicable. No additional management support is needed unless otherwise documented below in the visit note. 

## 2015-05-29 NOTE — Patient Instructions (Addendum)
Continue lifestyle intervention healthy eating and exercise .  And  healthy weight  Avoid  limited breads and processed carbs ... Your sugar levels are in the prediabetic range . Let us know if you want to see nutritionist or try adding metformin medicaiton . But  Seems like doing ok .   6 months check  Will do hg a1c at that visit    Health Maintenance, Male A healthy lifestyle and preventative care can promote health and wellness.  Maintain regular health, dental, and eye exams.  Eat a healthy diet. Foods like vegetables, fruits, whole grains, low-fat dairy products, and lean protein foods contain the nutrients you need and are low in calories. Decrease your intake of foods high in solid fats, added sugars, and salt. Get information about a proper diet from your health care provider, if necessary.  Regular physical exercise is one of the most important things you can do for your health. Most adults should get at least 150 minutes of moderate-intensity exercise (any activity that increases your heart rate and causes you to sweat) each week. In addition, most adults need muscle-strengthening exercises on 2 or more days a week.   Maintain a healthy weight. The body mass index (BMI) is a screening tool to identify possible weight problems. It provides an estimate of body fat based on height and weight. Your health care provider can find your BMI and can help you achieve or maintain a healthy weight. For males 20 years and older:  A BMI below 18.5 is considered underweight.  A BMI of 18.5 to 24.9 is normal.  A BMI of 25 to 29.9 is considered overweight.  A BMI of 30 and above is considered obese.  Maintain normal blood lipids and cholesterol by exercising and minimizing your intake of saturated fat. Eat a balanced diet with plenty of fruits and vegetables. Blood tests for lipids and cholesterol should begin at age 74 and be repeated every 5 years. If your lipid or cholesterol levels are  high, you are over age 75, or you are at high risk for heart disease, you may need your cholesterol levels checked more frequently.Ongoing high lipid and cholesterol levels should be treated with medicines if diet and exercise are not working.  If you smoke, find out from your health care provider how to quit. If you do not use tobacco, do not start.  Lung cancer screening is recommended for adults aged 49-80 years who are at high risk for developing lung cancer because of a history of smoking. A yearly low-dose CT scan of the lungs is recommended for people who have at least a 30-pack-year history of smoking and are current smokers or have quit within the past 15 years. A pack year of smoking is smoking an average of 1 pack of cigarettes a day for 1 year (for example, a 30-pack-year history of smoking could mean smoking 1 pack a day for 30 years or 2 packs a day for 15 years). Yearly screening should continue until the smoker has stopped smoking for at least 15 years. Yearly screening should be stopped for people who develop a health problem that would prevent them from having lung cancer treatment.  If you choose to drink alcohol, do not have more than 2 drinks per day. One drink is considered to be 12 oz (360 mL) of beer, 5 oz (150 mL) of wine, or 1.5 oz (45 mL) of liquor.  Avoid the use of street drugs. Do not share needles  with anyone. Ask for help if you need support or instructions about stopping the use of drugs.  High blood pressure causes heart disease and increases the risk of stroke. High blood pressure is more likely to develop in:  People who have blood pressure in the end of the normal range (100-139/85-89 mm Hg).  People who are overweight or obese.  People who are African American.  If you are 69-37 years of age, have your blood pressure checked every 3-5 years. If you are 28 years of age or older, have your blood pressure checked every year. You should have your blood pressure  measured twice--once when you are at a hospital or clinic, and once when you are not at a hospital or clinic. Record the average of the two measurements. To check your blood pressure when you are not at a hospital or clinic, you can use:  An automated blood pressure machine at a pharmacy.  A home blood pressure monitor.  If you are 22-73 years old, ask your health care provider if you should take aspirin to prevent heart disease.  Diabetes screening involves taking a blood sample to check your fasting blood sugar level. This should be done once every 3 years after age 48 if you are at a normal weight and without risk factors for diabetes. Testing should be considered at a younger age or be carried out more frequently if you are overweight and have at least 1 risk factor for diabetes.  Colorectal cancer can be detected and often prevented. Most routine colorectal cancer screening begins at the age of 79 and continues through age 49. However, your health care provider may recommend screening at an earlier age if you have risk factors for colon cancer. On a yearly basis, your health care provider may provide home test kits to check for hidden blood in the stool. A small camera at the end of a tube may be used to directly examine the colon (sigmoidoscopy or colonoscopy) to detect the earliest forms of colorectal cancer. Talk to your health care provider about this at age 48 when routine screening begins. A direct exam of the colon should be repeated every 5-10 years through age 15, unless early forms of precancerous polyps or small growths are found.  People who are at an increased risk for hepatitis B should be screened for this virus. You are considered at high risk for hepatitis B if:  You were born in a country where hepatitis B occurs often. Talk with your health care provider about which countries are considered high risk.  Your parents were born in a high-risk country and you have not received a  shot to protect against hepatitis B (hepatitis B vaccine).  You have HIV or AIDS.  You use needles to inject street drugs.  You live with, or have sex with, someone who has hepatitis B.  You are a man who has sex with other men (MSM).  You get hemodialysis treatment.  You take certain medicines for conditions like cancer, organ transplantation, and autoimmune conditions.  Hepatitis C blood testing is recommended for all people born from [birth year] through 1966 and any individual with known risk factors for hepatitis C.  Healthy men should no longer receive prostate-specific antigen (PSA) blood tests as part of routine cancer screening. Talk to your health care provider about prostate cancer screening.  Testicular cancer screening is not recommended for adolescents or adult males who have no symptoms. Screening includes self-exam, a health care provider  exam, and other screening tests. Consult with your health care provider about any symptoms you have or any concerns you have about testicular cancer.  Practice safe sex. Use condoms and avoid high-risk sexual practices to reduce the spread of sexually transmitted infections (STIs).  You should be screened for STIs, including gonorrhea and chlamydia if:  You are sexually active and are younger than 24 years.  You are older than 24 years, and your health care provider tells you that you are at risk for this type of infection.  Your sexual activity has changed since you were last screened, and you are at an increased risk for chlamydia or gonorrhea. Ask your health care provider if you are at risk.  If you are at risk of being infected with HIV, it is recommended that you take a prescription medicine daily to prevent HIV infection. This is called pre-exposure prophylaxis (PrEP). You are considered at risk if:  You are a man who has sex with other men (MSM).  You are a heterosexual man who is sexually active with multiple partners.  You take  drugs by injection.  You are sexually active with a partner who has HIV.  Talk with your health care provider about whether you are at high risk of being infected with HIV. If you choose to begin PrEP, you should first be tested for HIV. You should then be tested every 3 months for as long as you are taking PrEP.  Use sunscreen. Apply sunscreen liberally and repeatedly throughout the day. You should seek shade when your shadow is shorter than you. Protect yourself by wearing long sleeves, pants, a wide-brimmed hat, and sunglasses year round whenever you are outdoors.  Tell your health care provider of new moles or changes in moles, especially if there is a change in shape or color. Also, tell your health care provider if a mole is larger than the size of a pencil eraser.  A one-time screening for abdominal aortic aneurysm (AAA) and surgical repair of large AAAs by ultrasound is recommended for men aged 22-75 years who are current or former smokers.  Stay current with your vaccines (immunizations).   This information is not intended to replace advice given to you by your health care provider. Make sure you discuss any questions you have with your health care provider.   Document Released: 07/06/2007 Document Revised: 01/28/2014 Document Reviewed: 06/04/2010 Elsevier Interactive Patient Education [employer].

## 2015-11-20 ENCOUNTER — Telehealth: Payer: Self-pay | Admitting: Internal Medicine

## 2015-11-20 ENCOUNTER — Other Ambulatory Visit: Payer: Self-pay | Admitting: Family Medicine

## 2015-11-20 DIAGNOSIS — R7301 Impaired fasting glucose: Secondary | ICD-10-CM

## 2015-11-20 NOTE — Telephone Encounter (Signed)
Ok to just do the lab Hga1c  But if elevated we  May ask him to come in for an OV to discuss .

## 2015-11-20 NOTE — Telephone Encounter (Signed)
I have placed the lab order.  Please help him to make lab appointment.  Inform him that if lab work comes back abnormal than [facility] would like to see him in [place].

## 2015-11-20 NOTE — Telephone Encounter (Signed)
Pt had cpe in May. It was his understanding to return for A1c check only. Jowever., pt was scheduled an office visit 11/06 Pt states he is doing fine, and wants to know if OK to do the lab only.

## 2015-11-21 NOTE — Telephone Encounter (Signed)
Pt has been scheduled for labs only. Pt aware if labs come back abnormal, OV may be needed.

## 2015-11-27 ENCOUNTER — Ambulatory Visit: Payer: BLUE CROSS/BLUE SHIELD | Admitting: Internal Medicine

## 2015-11-27 ENCOUNTER — Other Ambulatory Visit (INDEPENDENT_AMBULATORY_CARE_PROVIDER_SITE_OTHER): Payer: BLUE CROSS/BLUE SHIELD

## 2015-11-27 DIAGNOSIS — E119 Type 2 diabetes mellitus without complications: Secondary | ICD-10-CM

## 2015-11-28 LAB — HEMOGLOBIN A1C: Hgb A1c MFr Bld: 6.3 % (ref 4.6–6.5)

## 2016-03-13 ENCOUNTER — Ambulatory Visit (INDEPENDENT_AMBULATORY_CARE_PROVIDER_SITE_OTHER): Payer: BLUE CROSS/BLUE SHIELD

## 2016-03-13 DIAGNOSIS — Z23 Encounter for immunization: Secondary | ICD-10-CM | POA: Diagnosis not present

## 2016-03-13 DIAGNOSIS — Z2911 Encounter for prophylactic immunotherapy for respiratory syncytial virus (RSV): Secondary | ICD-10-CM

## 2016-03-13 NOTE — Progress Notes (Signed)
Patient got his Zostarvax shingle vaccine on 03/13/2016 at 11.30am.Given by Rosealee Albee CMA.

## 2016-05-20 ENCOUNTER — Other Ambulatory Visit: Payer: Self-pay | Admitting: Internal Medicine

## 2016-05-21 ENCOUNTER — Ambulatory Visit (INDEPENDENT_AMBULATORY_CARE_PROVIDER_SITE_OTHER): Payer: BLUE CROSS/BLUE SHIELD | Admitting: Family Medicine

## 2016-05-21 ENCOUNTER — Encounter: Payer: Self-pay | Admitting: Family Medicine

## 2016-05-21 VITALS — BP 122/74 | HR 86 | Temp 98.0°F | Ht 65.0 in | Wt 237.1 lb

## 2016-05-21 DIAGNOSIS — R1084 Generalized abdominal pain: Secondary | ICD-10-CM

## 2016-05-21 DIAGNOSIS — E669 Obesity, unspecified: Secondary | ICD-10-CM | POA: Diagnosis not present

## 2016-05-21 DIAGNOSIS — Z8546 Personal history of malignant neoplasm of prostate: Secondary | ICD-10-CM | POA: Diagnosis not present

## 2016-05-21 DIAGNOSIS — Z87442 Personal history of urinary calculi: Secondary | ICD-10-CM | POA: Diagnosis not present

## 2016-05-21 DIAGNOSIS — Z8719 Personal history of other diseases of the digestive system: Secondary | ICD-10-CM | POA: Diagnosis not present

## 2016-05-21 LAB — POCT URINALYSIS DIPSTICK
Bilirubin, UA: NEGATIVE
GLUCOSE UA: NEGATIVE
Ketones, UA: NEGATIVE
Leukocytes, UA: NEGATIVE
NITRITE UA: NEGATIVE
Spec Grav, UA: 1.02 (ref 1.010–1.025)
UROBILINOGEN UA: 0.2 U/dL
pH, UA: 6 (ref 5.0–8.0)

## 2016-05-21 NOTE — Progress Notes (Signed)
Pre visit review using our clinic review tool, if applicable. No additional management support is needed unless otherwise documented below in the visit note. 

## 2016-05-21 NOTE — Progress Notes (Signed)
HPI:  Acute visit for Abd Pain: -started 2-3 weeks ago, now worsening the last 1-2 days -reports intermittent pain around the umbilicus several times per day for several weeks -now constant mod dull pain mid and L mid abd for last few days that is worsening -no fevers, malaise, hematuria, dysuria, vomiting, diarrhea, constipation, melena, hematochezia -PMH/other factors: hx of diverticulitis, adhesions and kidney stones; lifts weights and works out several days per week - doesn't think did anything new  ROS: See pertinent positives and negatives per HPI.  Past Medical History:  Diagnosis Date  . Allergy   . History of renal stone   . Hx of bacterial pneumonia    hosp  2001  . Hx of diverticulitis of colon    hosp 2001  . Hyperlipidemia   . Hypertension   . Prostate CA ([facility]) 10-24-11   7'13- bx. of prostate + cancer-surgery planned  . Prostate cancer ([facility]) 10/31/2011    Past Surgical History:  Procedure Laterality Date  . ROBOT ASSISTED LAPAROSCOPIC RADICAL PROSTATECTOMY  10/28/2011   Procedure: ROBOTIC ASSISTED LAPAROSCOPIC RADICAL PROSTATECTOMY;  Surgeon: Malka So, MD;  Location: [facility];  Service: Urology;  Laterality: N/A;  . SHOULDER SURGERY     lt. arthroscopy  . WISDOM TOOTH EXTRACTION  10-24-11   x2 extracted    Family History  Problem Relation Age of Onset  . Diabetes Mother   . Colon cancer Father   . Cancer Father     colon  . Diabetes Sister     borderline  . Diabetes Brother     borderline  . ADD / ADHD Son     Social History   Social History  . Marital status: Married    Spouse name: N/A  . Number of children: N/A  . Years of education: N/A   Social History Main Topics  . Smoking status: Never Smoker  . Smokeless tobacco: Never Used  . Alcohol use Yes     Comment: very use 1-2 per yr  . Drug use: No  . Sexual activity: Not Currently   Other Topics Concern  . 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    Accountant   2 cats and a dog     Current Outpatient Prescriptions:  .  atorvastatin (LIPITOR) 20 MG tablet, TAKE 1 TABLET BY MOUTH EVERY DAY, Disp: 90 tablet, Rfl: 0 .  fluticasone (FLONASE) 50 MCG/ACT nasal spray, Place 2 sprays into both nostrils daily., Disp: 48 g, Rfl: prn .  valsartan-hydrochlorothiazide (DIOVAN-HCT) 320-25 MG tablet, Take 1 tablet by mouth daily., Disp: 90 tablet, Rfl: 3  EXAM:  Vitals:   05/21/16 1544  BP: 122/74  Pulse: 86  Temp: 98 F (36.7 C)    Body mass index is 39.46 kg/m.  GENERAL: vitals reviewed and listed above, alert, oriented, appears well hydrated and in no acute distress  HEENT: atraumatic, conjunttiva clear, no obvious abnormalities on inspection of external nose and ears  NECK: no obvious masses on inspection  LUNGS: clear to auscultation bilaterally, no wheezes, rales or rhonchi, good air movement  CV: HRRR, no peripheral edema  ABD: BS hypoactive, obese, soft, difuse TTP mid and L abd, not rebound or sig guarding  MS: moves all extremities without noticeable abnormality  PSYCH: pleasant and cooperative, no obvious depression or anxiety  ASSESSMENT AND PLAN:  Discussed the following assessment and plan:  Generalized abdominal pain - Plan: Basic metabolic  panel, CBC with Differential/Platelet, CT Abdomen Pelvis W Contrast, POC Urinalysis Dipstick  History of kidney stones - Plan: CT Abdomen Pelvis W Contrast  History of diverticulitis - Plan: CT Abdomen Pelvis W Contrast  History of prostate cancer - Plan: CT Abdomen Pelvis W Contrast  Obesity (BMI 30-39.9)  -we discussed possible serious and likely etiologies, workup and treatment, treatment risks and return precautions - diverticulitis, partial obstruction or muscle stainthought most likely, symptoms atypical for kidney stone, but urine dip with blood -after this discussion, Elester opted for labs, CT abd/pelvis, will get urine micro as well -of course, we advised Saqib   That if symptoms are worsening or changing between now and the CT scan to seek care immediately   Patient Instructions  BEFORE YOU LEAVE: -urine dip -CT scan in next 2 days -labs  Get the labs and CT scan.  Seek care immediately if worsening or new concerns in the interim.      Colin Benton R., DO

## 2016-05-21 NOTE — Patient Instructions (Addendum)
BEFORE YOU LEAVE: -urine dip -CT scan appt details -labs  Get the labs and CT scan.  Seek care immediately if worsening or new concerns in the interim.

## 2016-05-22 LAB — CBC WITH DIFFERENTIAL/PLATELET
Basophils Absolute: 0.1 10*3/uL (ref 0.0–0.1)
Basophils Relative: 0.7 % (ref 0.0–3.0)
EOS ABS: 0.4 10*3/uL (ref 0.0–0.7)
Eosinophils Relative: 3.5 % (ref 0.0–5.0)
HCT: 43 % (ref 39.0–52.0)
Hemoglobin: 14.4 g/dL (ref 13.0–17.0)
LYMPHS ABS: 1.8 10*3/uL (ref 0.7–4.0)
LYMPHS PCT: 18.1 % (ref 12.0–46.0)
MCHC: 33.5 g/dL (ref 30.0–36.0)
MCV: 86.8 fl (ref 78.0–100.0)
MONOS PCT: 8.9 % (ref 3.0–12.0)
Monocytes Absolute: 0.9 10*3/uL (ref 0.1–1.0)
NEUTROS ABS: 7 10*3/uL (ref 1.4–7.7)
NEUTROS PCT: 68.8 % (ref 43.0–77.0)
PLATELETS: 291 10*3/uL (ref 150.0–400.0)
RBC: 4.95 Mil/uL (ref 4.22–5.81)
RDW: 14.3 % (ref 11.5–15.5)
WBC: 10.1 10*3/uL (ref 4.0–10.5)

## 2016-05-22 LAB — BASIC METABOLIC PANEL
BUN: 21 mg/dL (ref 6–23)
CHLORIDE: 102 meq/L (ref 96–112)
CO2: 31 mEq/L (ref 19–32)
CREATININE: 1.24 mg/dL (ref 0.40–1.50)
Calcium: 9.5 mg/dL (ref 8.4–10.5)
GFR: 61.9 mL/min (ref >60.00)
Glucose, Bld: 127 mg/dL — ABNORMAL HIGH (ref 70–99)
POTASSIUM: 3.5 meq/L (ref 3.5–5.1)
SODIUM: 141 meq/L (ref 135–145)

## 2016-05-22 LAB — URINALYSIS, MICROSCOPIC ONLY

## 2016-05-23 ENCOUNTER — Ambulatory Visit (INDEPENDENT_AMBULATORY_CARE_PROVIDER_SITE_OTHER)
Admission: RE | Admit: 2016-05-23 | Discharge: 2016-05-23 | Disposition: A | Discharge status: Home / Self Care | Payer: BLUE CROSS/BLUE SHIELD | Source: Ambulatory Visit | Attending: Family Medicine | Admitting: Family Medicine

## 2016-05-23 ENCOUNTER — Telehealth: Payer: Self-pay | Admitting: *Deleted

## 2016-05-23 DIAGNOSIS — Z8719 Personal history of other diseases of the digestive system: Secondary | ICD-10-CM

## 2016-05-23 DIAGNOSIS — Z8546 Personal history of malignant neoplasm of prostate: Secondary | ICD-10-CM

## 2016-05-23 DIAGNOSIS — N1330 Unspecified hydronephrosis: Secondary | ICD-10-CM

## 2016-05-23 DIAGNOSIS — R1084 Generalized abdominal pain: Secondary | ICD-10-CM

## 2016-05-23 DIAGNOSIS — Z87442 Personal history of urinary calculi: Secondary | ICD-10-CM

## 2016-05-23 DIAGNOSIS — N200 Calculus of kidney: Secondary | ICD-10-CM

## 2016-05-23 LAB — URINE CULTURE: Organism ID, Bacteria: NO GROWTH

## 2016-05-23 MED ORDER — IOPAMIDOL (ISOVUE-300) INJECTION 61%
100.0000 mL | Freq: Once | INTRAVENOUS | Status: AC | PRN
Start: 2016-05-23 — End: 2016-05-23
  Administered 2016-05-23: 100 mL via INTRAVENOUS

## 2016-05-23 NOTE — Telephone Encounter (Signed)
Per Dr Maudie Mercury the pt needs to see a urologist ASAP due to the CT which revealed hydronephrosis and a large kidney stone which is blocking an area.  I called [facility] at [phone] and spoke with the nurse Baxter Flattery and read her the impression from the CT and office notes.  She asked the notes and CT be sent to her for review first to see if they can get him with the on-call doctor.  Reports faxed to Baxter Flattery at [phone].

## 2016-05-23 NOTE — Telephone Encounter (Signed)
1:45pm-Dr Maudie Mercury spoke with the pt and informed him of the results and he stated he has an appt with the urologist today and will call their office to verify the time.

## 2016-05-24 ENCOUNTER — Other Ambulatory Visit: Payer: Self-pay | Admitting: Urology

## 2016-05-27 ENCOUNTER — Other Ambulatory Visit: Payer: BLUE CROSS/BLUE SHIELD

## 2016-05-27 ENCOUNTER — Encounter: Payer: Self-pay | Admitting: Internal Medicine

## 2016-05-27 ENCOUNTER — Inpatient Hospital Stay: Admission: RE | Admit: 2016-05-27 | Payer: BLUE CROSS/BLUE SHIELD | Source: Ambulatory Visit

## 2016-05-28 ENCOUNTER — Encounter (HOSPITAL_COMMUNITY): Payer: Self-pay | Admitting: *Deleted

## 2016-05-30 ENCOUNTER — Ambulatory Visit (HOSPITAL_COMMUNITY): Payer: BLUE CROSS/BLUE SHIELD

## 2016-05-30 ENCOUNTER — Encounter (HOSPITAL_COMMUNITY): Payer: Self-pay | Admitting: General Practice

## 2016-05-30 ENCOUNTER — Encounter (HOSPITAL_COMMUNITY)
Admission: RE | Disposition: A | Discharge status: Home / Self Care | Payer: Self-pay | Source: Ambulatory Visit | Attending: Urology

## 2016-05-30 ENCOUNTER — Ambulatory Visit (HOSPITAL_COMMUNITY)
Admission: RE | Admit: 2016-05-30 | Discharge: 2016-05-30 | Disposition: A | Discharge status: Home / Self Care | Payer: BLUE CROSS/BLUE SHIELD | Source: Ambulatory Visit | Attending: Urology | Admitting: Urology

## 2016-05-30 DIAGNOSIS — Z01818 Encounter for other preprocedural examination: Secondary | ICD-10-CM | POA: Diagnosis not present

## 2016-05-30 DIAGNOSIS — N200 Calculus of kidney: Secondary | ICD-10-CM | POA: Diagnosis not present

## 2016-05-30 DIAGNOSIS — Z87442 Personal history of urinary calculi: Secondary | ICD-10-CM | POA: Diagnosis present

## 2016-05-30 DIAGNOSIS — N201 Calculus of ureter: Secondary | ICD-10-CM

## 2016-05-30 HISTORY — PX: EXTRACORPOREAL SHOCK WAVE LITHOTRIPSY: SHX1557

## 2016-05-30 HISTORY — DX: Other complications of anesthesia, initial encounter: T88.59XA

## 2016-05-30 HISTORY — DX: Personal history of urinary calculi: Z87.442

## 2016-05-30 HISTORY — DX: Adverse effect of unspecified anesthetic, initial encounter: T41.45XA

## 2016-05-30 SURGERY — LITHOTRIPSY, ESWL
Anesthesia: LOCAL | Laterality: Left

## 2016-05-30 MED ORDER — CIPROFLOXACIN HCL 500 MG PO TABS
500.0000 mg | ORAL_TABLET | ORAL | Status: AC
  Administered 2016-05-30: 500 mg via ORAL
  Filled 2016-05-30: qty 1

## 2016-05-30 MED ORDER — SODIUM CHLORIDE 0.9 % IV SOLN
INTRAVENOUS | Status: DC
  Administered 2016-05-30: 09:00:00 via INTRAVENOUS

## 2016-05-30 MED ORDER — DIPHENHYDRAMINE HCL 25 MG PO CAPS
25.0000 mg | ORAL_CAPSULE | ORAL | Status: AC
  Administered 2016-05-30: 25 mg via ORAL
  Filled 2016-05-30: qty 1

## 2016-05-30 MED ORDER — DIAZEPAM 5 MG PO TABS
10.0000 mg | ORAL_TABLET | ORAL | Status: AC
  Administered 2016-05-30: 10 mg via ORAL
  Filled 2016-05-30: qty 2

## 2016-05-31 ENCOUNTER — Encounter (HOSPITAL_COMMUNITY): Payer: Self-pay | Admitting: Urology

## 2016-06-02 NOTE — Progress Notes (Signed)
Chief Complaint  Patient presents with  . Annual Exam    HPI: Patient  Daniel MCCOLLUM Sr.  66 y.o. comes in today for Preventive Health Care visit  And Chronic disease management  Recent hospt forrenal stone  Left  Still taking  Cholesterol.  Med fasting today  To see uro  Next week.  bp  No problem with med  LIDPS no problem with med   Health Maintenance  Topic Date Due  . FOOT EXAM  11/29/1960  . PNA vac Low Risk Adult (1 of 2 - PCV13) 11/29/2014  . HEMOGLOBIN A1C  05/26/2016  . Hepatitis C Screening  06/03/2017 (Originally 24-Dec-1950)  . INFLUENZA VACCINE  08/21/2016  . OPHTHALMOLOGY EXAM  01/21/2017  . COLONOSCOPY  04/12/2017  . TETANUS/TDAP  05/13/2022   Health Maintenance Review LIFESTYLE:  Exercise:   Walking  in am and gym 3 x per week weights .  Tobacco/ETS: no Alcohol:  no Sugar beverages: ocass  Not daily Sleep: 9 hours or so  Drug use: no HH of  2 and cat  Work: 40 not retired yet     ROS:  GEN/ HEENT: No fever, significant weight changes sweats headaches vision problems hearing changes, CV/ PULM; No chest pain shortness of breath cough, syncope,edema  change in exercise tolerance. GI /GU: No adominal pain, vomiting, change in bowel habits. No blood in the stool. No significant GU symptoms. SKIN/HEME: ,no acute skin rashes suspicious lesions or bleeding. No lymphadenopathy, nodules, masses.  NEURO/ PSYCH:  No neurologic signs such as weakness numbness. No depression anxiety. IMM/ Allergy: No unusual infections.  Allergy .   REST of 12 system review negative except as per HPI   Past Medical History:  Diagnosis Date  . Allergy   . Complication of anesthesia   . History of kidney stones   . History of renal stone   . Hx of bacterial pneumonia    hosp  2001  . Hx of diverticulitis of colon    hosp 2001  . Hyperlipidemia   . Hypertension   . Prostate CA ([facility]) 10-24-11   7'13- bx. of prostate + cancer-surgery planned  . Prostate cancer ([facility])  10/31/2011    Past Surgical History:  Procedure Laterality Date  . EXTRACORPOREAL SHOCK WAVE LITHOTRIPSY Left 05/30/2016   Procedure: LEFT EXTRACORPOREAL SHOCK WAVE LITHOTRIPSY (ESWL);  Surgeon: Cleon Gustin, MD;  Location: [facility];  Service: Urology;  Laterality: Left;  . ROBOT ASSISTED LAPAROSCOPIC RADICAL PROSTATECTOMY  10/28/2011   Procedure: ROBOTIC ASSISTED LAPAROSCOPIC RADICAL PROSTATECTOMY;  Surgeon: Malka So, MD;  Location: [facility];  Service: Urology;  Laterality: N/A;  . SHOULDER SURGERY     lt. arthroscopy  . WISDOM TOOTH EXTRACTION  10-24-11   x2 extracted    Family History  Problem Relation Age of Onset  . Diabetes Mother   . Colon cancer Father   . Cancer Father        colon  . Diabetes Sister        borderline  . Diabetes Brother        borderline  . ADD / ADHD Son     Social History   Social History  . Marital status: Married    Spouse name: N/A  . Number of children: N/A  . Years of education: N/A   Social History Main Topics  . Smoking status: Never Smoker  . Smokeless tobacco: Never Used  . Alcohol use Yes     Comment:  very use 1-2 per yr  . Drug use: No  . Sexual activity: Not Currently   Other Topics Concern  . 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    Accountant   2 cats and a dog    Outpatient Medications Prior to Visit  Medication Sig Dispense Refill  . atorvastatin (LIPITOR) 20 MG tablet TAKE 1 TABLET BY MOUTH EVERY DAY 90 tablet 0  . fluticasone (FLONASE) 50 MCG/ACT nasal spray Place 2 sprays into both nostrils daily. 48 g prn  . oxyCODONE-acetaminophen (PERCOCET/ROXICET) 5-325 MG tablet Take 1 tablet by mouth every 4 (four) hours as needed for severe pain.    . valsartan-hydrochlorothiazide (DIOVAN-HCT) 320-25 MG tablet Take 1 tablet by mouth daily. 90 tablet 3   No facility-administered medications prior to visit.      EXAM:  BP 110/80 (BP Location: Right Arm, Patient Position: Sitting,  Cuff Size: Large)   Pulse 65   Temp 98.5 F (36.9 C) (Oral)   Ht 5\' 6"  (1.676 m)   Wt 235 lb 12.8 oz (107 kg)   BMI 38.06 kg/m   Body mass index is 38.06 kg/m. Wt Readings from Last 3 Encounters:  06/03/16 235 lb 12.8 oz (107 kg)  05/30/16 233 lb 9.6 oz (106 kg)  05/21/16 237 lb 1.6 oz (107.5 kg)    Physical Exam: Vital signs reviewed TWS:FKCL is a well-developed well-nourished alert cooperative    who appearsr stated age in no acute distress.  HEENT: normocephalic atraumatic , Eyes: PERRL EOM's full, conjunctiva clear, Nares: paten,t no deformity discharge or tenderness., Ears: no deformity EAC's clear TMs with normal landmarks. Mouth: clear OP, no lesions, edema.  Moist mucous membranes. Dentition in adequate repair. NECK: supple without masses, thyromegaly or bruits. CHEST/PULM:  Clear to auscultation and percussion breath sounds equal no wheeze , rales or rhonchi. No chest wall deformities or tenderness.CV: PMI is nondisplaced, S1 S2 no gallops, murmurs, rubs. Peripheral pulses are full without delay.No JVD .  ABDOMEN: Bowel sounds normal nontender  No guard or rebound, no hepato splenomegal no CVA tenderness.  No hernia. Extremtities:  No clubbing cyanosis or edema, no acute joint swelling or redness no focal atrophy NEURO:  Oriented x3, cranial nerves 3-12 appear to be intact, no obvious focal weakness,gait within normal limits no abnormal reflexes or asymmetrical SKIN: No acute rashes normal turgor, color, no bruising or petechiae.  muiltiple sks  Left leg 2+ mm blue vascular bump  No scaling PSYCH: Oriented, good eye contact, no obvious depression anxiety, cognition and judgment appear normal. LN: no cervical axillary  adenopathy  Lab Results  Component Value Date   WBC 10.1 05/21/2016   HGB 14.4 05/21/2016   HCT 43.0 05/21/2016   PLT 291.0 05/21/2016   GLUCOSE 127 (H) 05/21/2016   CHOL 156 05/23/2015   TRIG 108.0 05/23/2015   HDL 36.00 (L) 05/23/2015   LDLCALC 98  05/23/2015   ALT 22 05/23/2015   AST 21 05/23/2015   NA 141 05/21/2016   K 3.5 05/21/2016   CL 102 05/21/2016   CREATININE 1.24 05/21/2016   BUN 21 05/21/2016   CO2 31 05/21/2016   TSH 2.71 05/23/2015   PSA 0.01 (L) 05/23/2015   HGBA1C 6.3 11/27/2015    BP Readings from Last 3 Encounters:  06/03/16 110/80  05/30/16 (!) 160/95  05/21/16 122/74   Wt Readings from Last 3 Encounters:  06/03/16 235 lb 12.8 oz (107  kg)  05/30/16 233 lb 9.6 oz (106 kg)  05/21/16 237 lb 1.6 oz (107.5 kg)    Lab results reviewed with patient   ASSESSMENT AND PLAN:  Discussed the following assessment and plan:  Visit for preventive health examination - Plan: Lipid panel, Hepatic function panel, Hemoglobin A1c, PSA  Fasting hyperglycemia - Plan: Hemoglobin A1c  Medication management - Plan: Lipid panel, Hepatic function panel  Hyperlipidemia, unspecified hyperlipidemia type - Plan: Lipid panel, Hepatic function panel  Essential hypertension  Personal history of prostate cancer - Plan: PSA  Need for prophylactic vaccination against Streptococcus pneumoniae (pneumococcus) - Plan: Pneumococcal conjugate vaccine 13-valent IM, DISCONTINUED: pneumococcal 13-valent conjugate vaccine (PREVNAR 13) injection 0.5 mL lipid a1c due  Atherosclerosis    Cont statin med  lsi changes  Patient Care Team: Burnis Medin, MD as PCP - General Inda Castle, MD (Gastroenterology) Alyson Ingles Candee Furbish, MD as Consulting Physician (Urology) Patient Instructions  Attention  To healthy life style  Will notify you  of labs when available.  Getting  prevnar 13 today   The ct showed some cyst on the liver    That may not be inprotant  Will revew and let  You know     Healthy lifestyle includes : At least 150 minutes of exercise weeks  , weight at healthy levels, which is usually   BMI 19-25. Avoid trans fats and processed foods;  Increase fresh fruits and veges to 5 servings per day. And avoid sweet beverages  including tea and juice. Mediterranean diet with olive oil and nuts have been noted to be heart and brain healthy . Avoid tobacco products . Limit  alcohol to  7 per week for women and 14 servings for men.  Get adequate sleep . Wear seat belts . Don't text and drive .    Health Maintenance, Male A healthy lifestyle and preventive care is important for your health and wellness. Ask your health care provider about what schedule of regular examinations is right for you. What should I know about weight and diet?  Eat a Healthy Diet  Eat plenty of vegetables, fruits, whole grains, low-fat dairy products, and lean protein.  Do not eat a lot of foods high in solid fats, added sugars, or salt. Maintain a Healthy Weight  Regular exercise can help you achieve or maintain a healthy weight. You should:  Do at least 150 minutes of exercise each week. The exercise should increase your heart rate and make you sweat (moderate-intensity exercise).  Do strength-training exercises at least twice a week. Watch Your Levels of Cholesterol and Blood Lipids  Have your blood tested for lipids and cholesterol every 5 years starting at 67 years of age. If you are at high risk for heart disease, you should start having your blood tested when you are 67 years old. You may need to have your cholesterol levels checked more often if:  Your lipid or cholesterol levels are high.  You are older than 67 years of age.  You are at high risk for heart disease. What should I know about cancer screening? Many types of cancers can be detected early and may often be prevented. Lung Cancer  You should be screened every year for lung cancer if:  You are a current smoker who has smoked for at least 30 years.  You are a former smoker who has quit within the past 15 years.  Talk to your health care provider about your screening options, when you  should start screening, and how often you should be screened. Colorectal  Cancer  Routine colorectal cancer screening usually begins at 67 years of age and should be repeated every 5-10 years until you are 67 years old. You may need to be screened more often if early forms of precancerous polyps or small growths are found. Your health care provider may recommend screening at an earlier age if you have risk factors for colon cancer.  Your health care provider may recommend using home test kits to check for hidden blood in the stool.  A small camera at the end of a tube can be used to examine your colon (sigmoidoscopy or colonoscopy). This checks for the earliest forms of colorectal cancer. Prostate and Testicular Cancer  Depending on your age and overall health, your health care provider may do certain tests to screen for prostate and testicular cancer.  Talk to your health care provider about any symptoms or concerns you have about testicular or prostate cancer. Skin Cancer  Check your skin from head to toe regularly.  Tell your health care provider about any new moles or changes in moles, especially if:  There is a change in a mole's size, shape, or color.  You have a mole that is larger than a pencil eraser.  Always use sunscreen. Apply sunscreen liberally and repeat throughout the day.  Protect yourself by wearing long sleeves, pants, a wide-brimmed hat, and sunglasses when outside. What should I know about heart disease, diabetes, and high blood pressure?  If you are 13-71 years of age, have your blood pressure checked every 3-5 years. If you are 35 years of age or older, have your blood pressure checked every year. You should have your blood pressure measured twice-once when you are at a hospital or clinic, and once when you are not at a hospital or clinic. Record the average of the two measurements. To check your blood pressure when you are not at a hospital or clinic, you can use:  An automated blood pressure machine at a pharmacy.  A home blood  pressure monitor.  Talk to your health care provider about your target blood pressure.  If you are between 53-36 years old, ask your health care provider if you should take aspirin to prevent heart disease.  Have regular diabetes screenings by checking your fasting blood sugar level.  If you are at a normal weight and have a low risk for diabetes, have this test once every three years after the age of 81.  If you are overweight and have a high risk for diabetes, consider being tested at a younger age or more often.  A one-time screening for abdominal aortic aneurysm (AAA) by ultrasound is recommended for men aged 64-75 years who are current or former smokers. What should I know about preventing infection? Hepatitis B  If you have a higher risk for hepatitis B, you should be screened for this virus. Talk with your health care provider to find out if you are at risk for hepatitis B infection. Hepatitis C  Blood testing is recommended for:  Everyone born from [birth year] through 1966.  Anyone with known risk factors for hepatitis C. Sexually Transmitted Diseases (STDs)  You should be screened each year for STDs including gonorrhea and chlamydia if:  You are sexually active and are younger than 67 years of age.  You are older than 67 years of age and your health care provider tells you that you are at risk for this  type of infection.  Your sexual activity has changed since you were last screened and you are at an increased risk for chlamydia or gonorrhea. Ask your health care provider if you are at risk.  Talk with your health care provider about whether you are at high risk of being infected with HIV. Your health care provider may recommend a prescription medicine to help prevent HIV infection. What else can I do?  Schedule regular health, dental, and eye exams.  Stay current with your vaccines (immunizations).  Do not use any tobacco products, such as cigarettes, chewing tobacco, and  e-cigarettes. If you need help quitting, ask your health care provider.  Limit alcohol intake to no more than 2 drinks per day. One drink equals 12 ounces of beer, 5 ounces of wine, or 1 ounces of hard liquor.  Do not use street drugs.  Do not share needles.  Ask your health care provider for help if you need support or information about quitting drugs.  Tell your health care provider if you often feel depressed.  Tell your health care provider if you have ever been abused or do not feel safe at home. This information is not intended to replace advice given to you by your health care provider. Make sure you discuss any questions you have with your health care provider. Document Released: 07/06/2007 Document Revised: 09/06/2015 Document Reviewed: 10/11/2014 Elsevier Interactive Patient Education  2017 [place]. Labresha Mellor [name]  IMPRESSION: 6 mm LEFT UPJ calculus with LEFT hydronephrosis.  Additional tiny BILATERAL nonobstructing renal calculi.  Distal colonic diverticulosis with a giant diverticulum versus loculated perforation adjacent to the sigmoid colon containing stool, demonstrating decreased pericolic inflammatory changes versus 2006.  Prior prostatectomy.  Aortic atherosclerosis and minimal coronary stroke calcification.  Fatty infiltration of liver with increase in size of a hepatic cyst since 2006.   Electronically Signed   By: Lavonia Dana [name]   On: 05/23/2016 11:19

## 2016-06-03 ENCOUNTER — Encounter: Payer: Self-pay | Admitting: Internal Medicine

## 2016-06-03 ENCOUNTER — Ambulatory Visit (INDEPENDENT_AMBULATORY_CARE_PROVIDER_SITE_OTHER): Payer: BLUE CROSS/BLUE SHIELD | Admitting: Internal Medicine

## 2016-06-03 VITALS — BP 110/80 | HR 65 | Temp 98.5°F | Ht 66.0 in | Wt 235.8 lb

## 2016-06-03 DIAGNOSIS — R7301 Impaired fasting glucose: Secondary | ICD-10-CM

## 2016-06-03 DIAGNOSIS — I10 Essential (primary) hypertension: Secondary | ICD-10-CM | POA: Diagnosis not present

## 2016-06-03 DIAGNOSIS — Z8546 Personal history of malignant neoplasm of prostate: Secondary | ICD-10-CM | POA: Diagnosis not present

## 2016-06-03 DIAGNOSIS — E785 Hyperlipidemia, unspecified: Secondary | ICD-10-CM

## 2016-06-03 DIAGNOSIS — Z79899 Other long term (current) drug therapy: Secondary | ICD-10-CM | POA: Diagnosis not present

## 2016-06-03 DIAGNOSIS — Z0000 Encounter for general adult medical examination without abnormal findings: Secondary | ICD-10-CM | POA: Diagnosis not present

## 2016-06-03 DIAGNOSIS — Z23 Encounter for immunization: Secondary | ICD-10-CM | POA: Diagnosis not present

## 2016-06-03 LAB — HEPATIC FUNCTION PANEL
ALT: 17 U/L (ref 0–53)
AST: 18 U/L (ref 0–37)
Albumin: 4.2 g/dL (ref 3.5–5.2)
Alkaline Phosphatase: 58 U/L (ref 39–117)
BILIRUBIN DIRECT: 0.1 mg/dL (ref 0.0–0.3)
BILIRUBIN TOTAL: 0.6 mg/dL (ref 0.2–1.2)
TOTAL PROTEIN: 7.1 g/dL (ref 6.0–8.3)

## 2016-06-03 LAB — HEMOGLOBIN A1C: HEMOGLOBIN A1C: 6.8 % — AB (ref 4.6–6.5)

## 2016-06-03 LAB — LIPID PANEL
CHOLESTEROL: 167 mg/dL (ref 0–200)
HDL: 36.1 mg/dL — ABNORMAL LOW (ref >39.00)
LDL CALC: 97 mg/dL (ref 0–99)
NonHDL: 131.27
TRIGLYCERIDES: 172 mg/dL — AB (ref 0.0–149.0)
Total CHOL/HDL Ratio: 5
VLDL: 34.4 mg/dL (ref 0.0–40.0)

## 2016-06-03 LAB — PSA: PSA: 0.01 ng/mL — AB (ref 0.10–4.00)

## 2016-06-03 MED ORDER — PNEUMOCOCCAL 13-VAL CONJ VACC IM SUSP: 0.5000 mL | INTRAMUSCULAR | Status: DC

## 2016-06-03 NOTE — Patient Instructions (Addendum)
Attention  To healthy life style  Will notify you  of labs when available.  Getting  prevnar 13 today   The ct showed some cyst on the liver    That may not be inprotant  Will revew and let  You know     Healthy lifestyle includes : At least 150 minutes of exercise weeks  , weight at healthy levels, which is usually   BMI 19-25. Avoid trans fats and processed foods;  Increase fresh fruits and veges to 5 servings per day. And avoid sweet beverages including tea and juice. Mediterranean diet with olive oil and nuts have been noted to be heart and brain healthy . Avoid tobacco products . Limit  alcohol to  7 per week for women and 14 servings for men.  Get adequate sleep . Wear seat belts . Don't text and drive .    Health Maintenance, Male A healthy lifestyle and preventive care is important for your health and wellness. Ask your health care provider about what schedule of regular examinations is right for you. What should I know about weight and diet?  Eat a Healthy Diet  Eat plenty of vegetables, fruits, whole grains, low-fat dairy products, and lean protein.  Do not eat a lot of foods high in solid fats, added sugars, or salt. Maintain a Healthy Weight  Regular exercise can help you achieve or maintain a healthy weight. You should:  Do at least 150 minutes of exercise each week. The exercise should increase your heart rate and make you sweat (moderate-intensity exercise).  Do strength-training exercises at least twice a week. Watch Your Levels of Cholesterol and Blood Lipids  Have your blood tested for lipids and cholesterol every 5 years starting at 67 years of age. If you are at high risk for heart disease, you should start having your blood tested when you are 67 years old. You may need to have your cholesterol levels checked more often if:  Your lipid or cholesterol levels are high.  You are older than 67 years of age.  You are at high risk for heart disease. What should  I know about cancer screening? Many types of cancers can be detected early and may often be prevented. Lung Cancer  You should be screened every year for lung cancer if:  You are a current smoker who has smoked for at least 30 years.  You are a former smoker who has quit within the past 15 years.  Talk to your health care provider about your screening options, when you should start screening, and how often you should be screened. Colorectal Cancer  Routine colorectal cancer screening usually begins at 67 years of age and should be repeated every 5-10 years until you are 67 years old. You may need to be screened more often if early forms of precancerous polyps or small growths are found. Your health care provider may recommend screening at an earlier age if you have risk factors for colon cancer.  Your health care provider may recommend using home test kits to check for hidden blood in the stool.  A small camera at the end of a tube can be used to examine your colon (sigmoidoscopy or colonoscopy). This checks for the earliest forms of colorectal cancer. Prostate and Testicular Cancer  Depending on your age and overall health, your health care provider may do certain tests to screen for prostate and testicular cancer.  Talk to your health care provider about any symptoms or concerns  you have about testicular or prostate cancer. Skin Cancer  Check your skin from head to toe regularly.  Tell your health care provider about any new moles or changes in moles, especially if:  There is a change in a mole's size, shape, or color.  You have a mole that is larger than a pencil eraser.  Always use sunscreen. Apply sunscreen liberally and repeat throughout the day.  Protect yourself by wearing long sleeves, pants, a wide-brimmed hat, and sunglasses when outside. What should I know about heart disease, diabetes, and high blood pressure?  If you are 21-72 years of age, have your blood pressure  checked every 3-5 years. If you are 56 years of age or older, have your blood pressure checked every year. You should have your blood pressure measured twice-once when you are at a hospital or clinic, and once when you are not at a hospital or clinic. Record the average of the two measurements. To check your blood pressure when you are not at a hospital or clinic, you can use:  An automated blood pressure machine at a pharmacy.  A home blood pressure monitor.  Talk to your health care provider about your target blood pressure.  If you are between 30-74 years old, ask your health care provider if you should take aspirin to prevent heart disease.  Have regular diabetes screenings by checking your fasting blood sugar level.  If you are at a normal weight and have a low risk for diabetes, have this test once every three years after the age of 61.  If you are overweight and have a high risk for diabetes, consider being tested at a younger age or more often.  A one-time screening for abdominal aortic aneurysm (AAA) by ultrasound is recommended for men aged 21-75 years who are current or former smokers. What should I know about preventing infection? Hepatitis B  If you have a higher risk for hepatitis B, you should be screened for this virus. Talk with your health care provider to find out if you are at risk for hepatitis B infection. Hepatitis C  Blood testing is recommended for:  Everyone born from [birth year] through 1966.  Anyone with known risk factors for hepatitis C. Sexually Transmitted Diseases (STDs)  You should be screened each year for STDs including gonorrhea and chlamydia if:  You are sexually active and are younger than 67 years of age.  You are older than 67 years of age and your health care provider tells you that you are at risk for this type of infection.  Your sexual activity has changed since you were last screened and you are at an increased risk for chlamydia or gonorrhea.  Ask your health care provider if you are at risk.  Talk with your health care provider about whether you are at high risk of being infected with HIV. Your health care provider may recommend a prescription medicine to help prevent HIV infection. What else can I do?  Schedule regular health, dental, and eye exams.  Stay current with your vaccines (immunizations).  Do not use any tobacco products, such as cigarettes, chewing tobacco, and e-cigarettes. If you need help quitting, ask your health care provider.  Limit alcohol intake to no more than 2 drinks per day. One drink equals 12 ounces of beer, 5 ounces of wine, or 1 ounces of hard liquor.  Do not use street drugs.  Do not share needles.  Ask your health care provider for help if you  need support or information about quitting drugs.  Tell your health care provider if you often feel depressed.  Tell your health care provider if you have ever been abused or do not feel safe at home. This information is not intended to replace advice given to you by your health care provider. Make sure you discuss any questions you have with your health care provider. Document Released: 07/06/2007 Document Revised: 09/06/2015 Document Reviewed: 10/11/2014 Elsevier Interactive Patient Education  2017 [employer].

## 2016-06-06 ENCOUNTER — Other Ambulatory Visit: Payer: Self-pay | Admitting: Emergency Medicine

## 2016-06-06 MED ORDER — METFORMIN HCL ER 500 MG PO TB24: 500.0000 mg | ORAL_TABLET | Freq: Every day | ORAL | 5 refills | Status: DC

## 2016-08-13 ENCOUNTER — Other Ambulatory Visit: Payer: Self-pay | Admitting: Internal Medicine

## 2016-08-14 ENCOUNTER — Telehealth: Payer: Self-pay | Admitting: Emergency Medicine

## 2016-08-14 ENCOUNTER — Other Ambulatory Visit: Payer: Self-pay | Admitting: Emergency Medicine

## 2016-08-14 MED ORDER — TELMISARTAN-HCTZ 80-25 MG PO TABS: 1.0000 | ORAL_TABLET | Freq: Every day | ORAL | 0 refills | Status: DC

## 2016-08-14 NOTE — Telephone Encounter (Signed)
Spoke with patient and explained that medication has been recalled (valsartan)  And Dr. Regis Bill wants him to start Telmistartan. Patient understood and will f/u in 3 months with readings. Prescription has been sent in

## 2016-08-14 NOTE — Telephone Encounter (Signed)
Spoke with the pharmacist and valsartan has been recalled. Patient would like an alternative. Please advise.

## 2016-08-14 NOTE — Telephone Encounter (Signed)
Change to telmisartan  hctz 80/25  Disp 90  Send [place] bp readings in 3 months or rov   Goal is 120/80 or below

## 2016-09-11 ENCOUNTER — Other Ambulatory Visit: Payer: Self-pay | Admitting: Internal Medicine

## 2016-09-13 ENCOUNTER — Other Ambulatory Visit: Payer: Self-pay | Admitting: Internal Medicine

## 2016-10-11 ENCOUNTER — Encounter: Payer: Self-pay | Admitting: Internal Medicine

## 2016-11-21 ENCOUNTER — Other Ambulatory Visit: Payer: Self-pay | Admitting: Internal Medicine

## 2016-12-05 ENCOUNTER — Other Ambulatory Visit: Payer: Self-pay | Admitting: Internal Medicine

## 2017-01-08 DIAGNOSIS — E119 Type 2 diabetes mellitus without complications: Secondary | ICD-10-CM | POA: Diagnosis not present

## 2017-01-08 LAB — HM DIABETES EYE EXAM

## 2017-01-30 ENCOUNTER — Encounter: Payer: Self-pay | Admitting: Internal Medicine

## 2017-02-26 ENCOUNTER — Other Ambulatory Visit: Payer: Self-pay | Admitting: Internal Medicine

## 2017-02-27 ENCOUNTER — Other Ambulatory Visit: Payer: Self-pay

## 2017-02-27 MED ORDER — METFORMIN HCL ER 500 MG PO TB24: 500.0000 mg | ORAL_TABLET | Freq: Every day | ORAL | 0 refills | Status: DC

## 2017-02-27 MED ORDER — ATORVASTATIN CALCIUM 20 MG PO TABS: 20.0000 mg | ORAL_TABLET | Freq: Every day | ORAL | 0 refills | Status: DC

## 2017-03-12 ENCOUNTER — Other Ambulatory Visit: Payer: Self-pay | Admitting: Internal Medicine

## 2017-03-13 ENCOUNTER — Telehealth: Payer: Self-pay | Admitting: *Deleted

## 2017-03-13 NOTE — Telephone Encounter (Signed)
Prior auth for Telmisartan sent to [email or web address].

## 2017-03-14 NOTE — Telephone Encounter (Signed)
Fax received from [facility] stating the Rx is on the pts list of covered drugs and a prior auth is not required at this time.  I called [facility] and informed Felecia of this.

## 2017-04-06 ENCOUNTER — Encounter: Payer: Self-pay | Admitting: Gastroenterology

## 2017-04-07 ENCOUNTER — Encounter: Payer: Self-pay | Admitting: Internal Medicine

## 2017-05-26 ENCOUNTER — Other Ambulatory Visit: Payer: Self-pay | Admitting: Internal Medicine

## 2017-06-04 NOTE — Progress Notes (Signed)
Chief Complaint  Patient presents with  . Annual Exam    No new concerns  . Hypertension  . Hyperlipidemia  . Medication Management    HPI: Patient  Daniel BEADNELL Sr.  67 y.o. comes in today for Daniel Randall visit  And ,Chronic disease management   BP doing ok  Had to be changes cause of recall meds  But no se noted   LIPIDS taking meds  BG prediabetes  No eye changes  Taking metformin   Kidney stone last year   No  Reg uro fu  For the prostate cancer   Health Maintenance  Topic Date Due  . Hepatitis C Screening  Jan 04, 1951  . FOOT EXAM  11/29/1959  . HEMOGLOBIN A1C  12/04/2016  . COLONOSCOPY  04/12/2017  . PNA vac Low Risk Adult (2 of 2 - PPSV23) 06/03/2017  . INFLUENZA VACCINE  08/21/2017  . OPHTHALMOLOGY EXAM  01/08/2018  . TETANUS/TDAP  05/13/2022   Health Maintenance Review LIFESTYLE:  Exercise:    gyme 3 d per week  Lifting .   Walking   Almost a mile.  Tobacco/ETS: no Alcohol:   2-3 x per year  Sugar beverages:5 per week.    Small cans.  Water at work  Sleep: 9 per night wakenings  Drug use: no HH of  2 1 cat  Work:  40 hours ft   ROS:  Standing   bending  And up takes breath up.  But no cp otherwise  GEN/ HEENT: No fever, significant weight changes sweats headaches vision problems hearing changes, CV/ PULM; No chest pain shortness of breath cough, syncope,edema  change in exercise tolerance. GI /GU: No adominal pain, vomiting, change in bowel habits. No blood in the stool. No significant GU symptoms. SKIN/HEME: ,no acute skin rashes suspicious lesions or bleeding. No lymphadenopathy, nodules, masses.  NEURO/ PSYCH:  No neurologic signs such as weakness numbness. No depression anxiety. IMM/ Allergy: No unusual infections.  Allergy .   REST of 12 system review negative except as per HPI   Past Medical History:  Diagnosis Date  . Allergy   . Complication of anesthesia   . History of kidney stones   . History of renal stone   . Hx of  bacterial pneumonia    hosp  2001  . Hx of diverticulitis of colon    hosp 2001  . Hyperlipidemia   . Hypertension   . Prostate CA ([place]) 10-24-11   7'13- bx. of prostate + cancer-surgery planned  . Prostate cancer ([place]) 10/31/2011    Past Surgical History:  Procedure Laterality Date  . EXTRACORPOREAL SHOCK WAVE LITHOTRIPSY Left 05/30/2016   Procedure: LEFT EXTRACORPOREAL SHOCK WAVE LITHOTRIPSY (ESWL);  Surgeon: Cleon Gustin, MD;  Location: [facility];  Service: Urology;  Laterality: Left;  . ROBOT ASSISTED LAPAROSCOPIC RADICAL PROSTATECTOMY  10/28/2011   Procedure: ROBOTIC ASSISTED LAPAROSCOPIC RADICAL PROSTATECTOMY;  Surgeon: Malka So, MD;  Location: [facility];  Service: Urology;  Laterality: N/A;  . SHOULDER SURGERY     lt. arthroscopy  . WISDOM TOOTH EXTRACTION  10-24-11   x2 extracted    Family History  Problem Relation Age of Onset  . Diabetes Mother   . Colon cancer Father   . Cancer Father        colon  . Diabetes Sister        borderline  . Diabetes Brother        borderline  . ADD / ADHD  Son     Social History   Socioeconomic History  . Marital status: Married    Spouse name: Not on file  . Number of children: Not on file  . Years of education: Not on file  . Highest education level: Not on file  Occupational History  . Not on file  Social Needs  . Financial resource strain: Not on file  . Food insecurity:    Worry: Not on file    Inability: Not on file  . Transportation needs:    Medical: Not on file    Non-medical: Not on file  Tobacco Use  . Smoking status: Never Smoker  . Smokeless tobacco: Never Used  Substance and Sexual Activity  . Alcohol use: Yes    Comment: very use 1-2 per yr  . Drug use: No  . Sexual activity: Not Currently  Lifestyle  . Physical activity:    Days per week: Not on file    Minutes per session: Not on file  . Stress: Not on file  Relationships  . Social connections:    Talks on phone: Not on file    Gets  together: Not on file    Attends religious service: Not on file    Active member of club or organization: Not on file    Attends meetings of clubs or organizations: Not on file    Relationship status: Not on file  Other Topics Concern  . 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    Accountant   2 cats and a dog    Outpatient Medications Prior to Visit  Medication Sig Dispense Refill  . atorvastatin (LIPITOR) 20 MG tablet TAKE 1 TABLET BY MOUTH EVERY DAY 90 tablet 0  . fluticasone (FLONASE) 50 MCG/ACT nasal spray PLACE 2 SPRAYS IN EACH NOSTRIL EVERY DAY 48 g prn  . metFORMIN (GLUCOPHAGE-XR) 500 MG 24 hr tablet Take 1 tablet (500 mg total) by mouth daily with breakfast. 90 tablet 0  . telmisartan-hydrochlorothiazide (MICARDIS HCT) 80-25 MG tablet Take 1 tablet by mouth daily. DUE FOR BP CHECK 90 tablet 0  . metFORMIN (GLUCOPHAGE-XR) 500 MG 24 hr tablet TAKE 1 TABLET BY MOUTH daily WITH breakfast (Patient not taking: Reported on 06/05/2017) 90 tablet 5  . oxyCODONE-acetaminophen (PERCOCET/ROXICET) 5-325 MG tablet Take 1 tablet by mouth every 4 (four) hours as needed for severe pain.    . valsartan-hydrochlorothiazide (DIOVAN-HCT) 320-25 MG tablet TAKE 1 TABLET BY MOUTH EVERY DAY (Patient not taking: Reported on 06/05/2017) 90 tablet 0   No facility-administered medications prior to visit.      EXAM:  BP 122/78 (BP Location: Right Arm, Patient Position: Sitting, Cuff Size: Normal)   Pulse 98   Temp 98.3 F (36.8 C) (Oral)   Ht 5' 4.75" (1.645 m)   Wt 240 lb 1.6 oz (108.9 kg)   BMI 40.26 kg/m   Body mass index is 40.26 kg/m. Wt Readings from Last 3 Encounters:  06/05/17 240 lb 1.6 oz (108.9 kg)  06/03/16 235 lb 12.8 oz (107 kg)  05/30/16 233 lb 9.6 oz (106 kg)    Physical Exam: Vital signs reviewed POE:UMPN is a well-developed well-nourished alert cooperative    who appearsr stated age in no acute distress.  HEENT: normocephalic atraumatic ,  Eyes: PERRL EOM's full, conjunctiva clear, Nares: paten,t no deformity discharge or tenderness., Ears: no deformity EAC's clear TMs with normal landmarks. Mouth: clear OP, no  lesions, edema.  Moist mucous membranes. Dentition in adequate repair. NECK: supple without masses, thyromegaly or bruits. CHEST/PULM:  Clear to auscultation and percussion breath sounds equal no wheeze , rales or rhonchi. No chest wall deformities or tenderness. Breast: normal by inspection . No dimpling, discharge, masses, tenderness or discharge . CV: PMI is nondisplaced, S1 S2 no gallops, murmurs, rubs. Peripheral pulses are full without delay.No JVD .  ABDOMEN: Bowel sounds normal nontender  No guard or rebound, no hepato splenomegal no CVA tenderness.  No hernia. Extremtities:  No clubbing cyanosis or edema, no acute joint swelling or redness no focal atrophy NEURO:  Oriented x3, cranial nerves 3-12 appear to be intact, no obvious focal weakness,gait within normal limits no abnormal reflexes or asymmetrical SKIN: No acute rashes normal turgor, color, no bruising or petechiae. r upper back lipoma  PSYCH: Oriented, good eye contact, no obvious depression anxiety, cognition and judgment appear normal. LN: no cervical axillary inguinal adenopathy Diabetic Foot Exam - Simple   Simple Foot Form Diabetic Foot exam was performed with the following findings:  Yes 06/05/2017 10:06 AM  Visual Inspection No deformities, no ulcerations, no other skin breakdown bilaterally:  Yes Sensation Testing Intact to touch and monofilament testing bilaterally:  Yes Pulse Check Posterior Tibialis and Dorsalis pulse intact bilaterally:  Yes Comments      Lab Results  Component Value Date   WBC 10.1 05/21/2016   HGB 14.4 05/21/2016   HCT 43.0 05/21/2016   PLT 291.0 05/21/2016   GLUCOSE 127 (H) 05/21/2016   CHOL 167 06/03/2016   TRIG 172.0 (H) 06/03/2016   HDL 36.10 (L) 06/03/2016   LDLCALC 97 06/03/2016   ALT 17 06/03/2016   AST  18 06/03/2016   NA 141 05/21/2016   K 3.5 05/21/2016   CL 102 05/21/2016   CREATININE 1.24 05/21/2016   BUN 21 05/21/2016   CO2 31 05/21/2016   TSH 2.71 05/23/2015   PSA 0.01 (L) 06/03/2016   HGBA1C 6.8 (H) 06/03/2016    BP Readings from Last 3 Encounters:  06/05/17 122/78  06/03/16 110/80  05/30/16 (!) 160/95    Wt Readings from Last 3 Encounters:  06/05/17 240 lb 1.6 oz (108.9 kg)  06/03/16 235 lb 12.8 oz (107 kg)  05/30/16 233 lb 9.6 oz (106 kg)     ASSESSMENT AND PLAN:  Discussed the following assessment and plan:  Visit for preventive health examination - Plan: Basic metabolic panel, CBC with Differential/Platelet, Hemoglobin A1c, Hepatic function panel, Lipid panel, TSH, PSA, Microalbumin / creatinine urine ratio  Medication management - Plan: Basic metabolic panel, CBC with Differential/Platelet, Hemoglobin A1c, Hepatic function panel, Lipid panel, TSH, PSA, Microalbumin / creatinine urine ratio  Fasting hyperglycemia - Plan: Basic metabolic panel, CBC with Differential/Platelet, Hemoglobin A1c, Hepatic function panel, Lipid panel, TSH, PSA, Microalbumin / creatinine urine ratio  Hyperlipidemia, unspecified hyperlipidemia type - Plan: Basic metabolic panel, CBC with Differential/Platelet, Hemoglobin A1c, Hepatic function panel, Lipid panel, TSH, PSA, Microalbumin / creatinine urine ratio  Personal history of prostate cancer - Plan: Basic metabolic panel, CBC with Differential/Platelet, Hemoglobin A1c, Hepatic function panel, Lipid panel, TSH, PSA, Microalbumin / creatinine urine ratio  Obesity (BMI 30-39.9) - Plan: Basic metabolic panel, CBC with Differential/Platelet, Hemoglobin A1c, Hepatic function panel, Lipid panel, TSH, PSA, Microalbumin / creatinine urine ratio  Pre-diabetes - Plan: Basic metabolic panel, CBC with Differential/Platelet, Hemoglobin A1c, Hepatic function panel, Lipid panel, TSH, PSA, Microalbumin / creatinine urine ratio Reviewed meds and  intervnetion important to lose  weight  Has taken some helpful measures      Expectant management. And meds   Lab today and rov in 6 mos to check  a1c etc . And progress Patient Care Team: Shantel Helwig, Standley Brooking, MD as PCP - General McKenzie, Candee Furbish, MD as Consulting Physician (Urology) Patient Instructions  Will notify you  of labs when available.  Healthy weight loss  Needed.   Continue activity  No to little calories in  drinks . Change snacks to   100 - 150 calories without  Simple carbs .   rov depending on  Labs     6- 12 months  Or as needed      Health Maintenance, Male A healthy lifestyle and preventive care is important for your health and wellness. Ask your health care provider about what schedule of regular examinations is right for you. What should I know about weight and diet? Eat a Healthy Diet  Eat plenty of vegetables, fruits, whole grains, low-fat dairy products, and lean protein.  Do not eat a lot of foods high in solid fats, added sugars, or salt.  Maintain a Healthy Weight Regular exercise can help you achieve or maintain a healthy weight. You should:  Do at least 150 minutes of exercise each week. The exercise should increase your heart rate and make you sweat (moderate-intensity exercise).  Do strength-training exercises at least twice a week.  Watch Your Levels of Cholesterol and Blood Lipids  Have your blood tested for lipids and cholesterol every 5 years starting at 68 years of age. If you are at high risk for heart disease, you should start having your blood tested when you are 68 years old. You may need to have your cholesterol levels checked more often if: ? Your lipid or cholesterol levels are high. ? You are older than 68 years of age. ? You are at high risk for heart disease.  What should I know about cancer screening? Many types of cancers can be detected early and may often be prevented. Lung Cancer  You should be screened every year for lung  cancer if: ? You are a current smoker who has smoked for at least 30 years. ? You are a former smoker who has quit within the past 15 years.  Talk to your health care provider about your screening options, when you should start screening, and how often you should be screened.  Colorectal Cancer  Routine colorectal cancer screening usually begins at 68 years of age and should be repeated every 5-10 years until you are 68 years old. You may need to be screened more often if early forms of precancerous polyps or small growths are found. Your health care provider may recommend screening at an earlier age if you have risk factors for colon cancer.  Your health care provider may recommend using home test kits to check for hidden blood in the stool.  A small camera at the end of a tube can be used to examine your colon (sigmoidoscopy or colonoscopy). This checks for the earliest forms of colorectal cancer.  Prostate and Testicular Cancer  Depending on your age and overall health, your health care provider may do certain tests to screen for prostate and testicular cancer.  Talk to your health care provider about any symptoms or concerns you have about testicular or prostate cancer.  Skin Cancer  Check your skin from head to toe regularly.  Tell your health care provider about any new moles or changes  in moles, especially if: ? There is a change in a mole's size, shape, or color. ? You have a mole that is larger than a pencil eraser.  Always use sunscreen. Apply sunscreen liberally and repeat throughout the day.  Protect yourself by wearing long sleeves, pants, a wide-brimmed hat, and sunglasses when outside.  What should I know about heart disease, diabetes, and high blood pressure?  If you are 47-8 years of age, have your blood pressure checked every 3-5 years. If you are 54 years of age or older, have your blood pressure checked every year. You should have your blood pressure measured  twice-once when you are at a hospital or clinic, and once when you are not at a hospital or clinic. Record the average of the two measurements. To check your blood pressure when you are not at a hospital or clinic, you can use: ? An automated blood pressure machine at a pharmacy. ? A home blood pressure monitor.  Talk to your health care provider about your target blood pressure.  If you are between 34-36 years old, ask your health care provider if you should take aspirin to prevent heart disease.  Have regular diabetes screenings by checking your fasting blood sugar level. ? If you are at a normal weight and have a low risk for diabetes, have this test once every three years after the age of 74. ? If you are overweight and have a high risk for diabetes, consider being tested at a younger age or more often.  A one-time screening for abdominal aortic aneurysm (AAA) by ultrasound is recommended for men aged 1-75 years who are current or former smokers. What should I know about preventing infection? Hepatitis B If you have a higher risk for hepatitis B, you should be screened for this virus. Talk with your health care provider to find out if you are at risk for hepatitis B infection. Hepatitis C Blood testing is recommended for:  Everyone born from [birth year] through 1966.  Anyone with known risk factors for hepatitis C.  Sexually Transmitted Diseases (STDs)  You should be screened each year for STDs including gonorrhea and chlamydia if: ? You are sexually active and are younger than 68 years of age. ? You are older than 68 years of age and your health care provider tells you that you are at risk for this type of infection. ? Your sexual activity has changed since you were last screened and you are at an increased risk for chlamydia or gonorrhea. Ask your health care provider if you are at risk.  Talk with your health care provider about whether you are at high risk of being infected with HIV.  Your health care provider may recommend a prescription medicine to help prevent HIV infection.  What else can I do?  Schedule regular health, dental, and eye exams.  Stay current with your vaccines (immunizations).  Do not use any tobacco products, such as cigarettes, chewing tobacco, and e-cigarettes. If you need help quitting, ask your health care provider.  Limit alcohol intake to no more than 2 drinks per day. One drink equals 12 ounces of beer, 5 ounces of wine, or 1 ounces of hard liquor.  Do not use street drugs.  Do not share needles.  Ask your health care provider for help if you need support or information about quitting drugs.  Tell your health care provider if you often feel depressed.  Tell your health care provider if you have ever been  abused or do not feel safe at home. This information is not intended to replace advice given to you by your health care provider. Make sure you discuss any questions you have with your health care provider. Document Released: 07/06/2007 Document Revised: 09/06/2015 Document Reviewed: 10/11/2014 Elsevier Interactive Patient Education  2018 [employer]. Dynver Clemson [name]

## 2017-06-05 ENCOUNTER — Ambulatory Visit (INDEPENDENT_AMBULATORY_CARE_PROVIDER_SITE_OTHER): Payer: BLUE CROSS/BLUE SHIELD | Admitting: Internal Medicine

## 2017-06-05 ENCOUNTER — Encounter: Payer: Self-pay | Admitting: Internal Medicine

## 2017-06-05 VITALS — BP 122/78 | HR 98 | Temp 98.3°F | Ht 64.75 in | Wt 240.1 lb

## 2017-06-05 DIAGNOSIS — R7301 Impaired fasting glucose: Secondary | ICD-10-CM | POA: Diagnosis not present

## 2017-06-05 DIAGNOSIS — R7303 Prediabetes: Secondary | ICD-10-CM

## 2017-06-05 DIAGNOSIS — E785 Hyperlipidemia, unspecified: Secondary | ICD-10-CM | POA: Diagnosis not present

## 2017-06-05 DIAGNOSIS — Z79899 Other long term (current) drug therapy: Secondary | ICD-10-CM | POA: Diagnosis not present

## 2017-06-05 DIAGNOSIS — Z8546 Personal history of malignant neoplasm of prostate: Secondary | ICD-10-CM | POA: Diagnosis not present

## 2017-06-05 DIAGNOSIS — Z0000 Encounter for general adult medical examination without abnormal findings: Secondary | ICD-10-CM | POA: Diagnosis not present

## 2017-06-05 DIAGNOSIS — E669 Obesity, unspecified: Secondary | ICD-10-CM

## 2017-06-05 LAB — BASIC METABOLIC PANEL
BUN: 19 mg/dL (ref 6–23)
CHLORIDE: 98 meq/L (ref 96–112)
CO2: 32 mEq/L (ref 19–32)
Calcium: 9.3 mg/dL (ref 8.4–10.5)
Creatinine, Ser: 1.11 mg/dL (ref 0.40–1.50)
GFR: 70.12 mL/min (ref >60.00)
Glucose, Bld: 98 mg/dL (ref 70–99)
POTASSIUM: 3.9 meq/L (ref 3.5–5.1)
SODIUM: 139 meq/L (ref 135–145)

## 2017-06-05 LAB — LIPID PANEL
CHOL/HDL RATIO: 4
Cholesterol: 159 mg/dL (ref 0–200)
HDL: 39.8 mg/dL (ref >39.00)
LDL CALC: 85 mg/dL (ref 0–99)
NonHDL: 119.45
Triglycerides: 173 mg/dL — ABNORMAL HIGH (ref 0.0–149.0)
VLDL: 34.6 mg/dL (ref 0.0–40.0)

## 2017-06-05 LAB — TSH: TSH: 4.5 u[IU]/mL (ref 0.35–4.50)

## 2017-06-05 LAB — CBC WITH DIFFERENTIAL/PLATELET
BASOS PCT: 0.6 % (ref 0.0–3.0)
Basophils Absolute: 0 10*3/uL (ref 0.0–0.1)
Eosinophils Absolute: 0.3 10*3/uL (ref 0.0–0.7)
Eosinophils Relative: 3.9 % (ref 0.0–5.0)
HEMATOCRIT: 42.1 % (ref 39.0–52.0)
HEMOGLOBIN: 14 g/dL (ref 13.0–17.0)
LYMPHS PCT: 22.6 % (ref 12.0–46.0)
Lymphs Abs: 1.8 10*3/uL (ref 0.7–4.0)
MCHC: 33.4 g/dL (ref 30.0–36.0)
MCV: 86 fl (ref 78.0–100.0)
MONO ABS: 0.7 10*3/uL (ref 0.1–1.0)
Monocytes Relative: 8.8 % (ref 3.0–12.0)
Neutro Abs: 5.1 10*3/uL (ref 1.4–7.7)
Neutrophils Relative %: 64.1 % (ref 43.0–77.0)
Platelets: 283 10*3/uL (ref 150.0–400.0)
RBC: 4.89 Mil/uL (ref 4.22–5.81)
RDW: 14.4 % (ref 11.5–15.5)
WBC: 8 10*3/uL (ref 4.0–10.5)

## 2017-06-05 LAB — HEMOGLOBIN A1C: HEMOGLOBIN A1C: 6.7 % — AB (ref 4.6–6.5)

## 2017-06-05 LAB — HEPATIC FUNCTION PANEL
ALK PHOS: 59 U/L (ref 39–117)
ALT: 18 U/L (ref 0–53)
AST: 17 U/L (ref 0–37)
Albumin: 4 g/dL (ref 3.5–5.2)
Bilirubin, Direct: 0.1 mg/dL (ref 0.0–0.3)
Total Bilirubin: 0.6 mg/dL (ref 0.2–1.2)
Total Protein: 7.2 g/dL (ref 6.0–8.3)

## 2017-06-05 LAB — MICROALBUMIN / CREATININE URINE RATIO
Creatinine,U: 91.1 mg/dL
MICROALB UR: 1.3 mg/dL (ref 0.0–1.9)
Microalb Creat Ratio: 1.4 mg/g (ref 0.0–30.0)

## 2017-06-05 LAB — PSA: PSA: 0.01 ng/mL — AB (ref 0.10–4.00)

## 2017-06-05 NOTE — Patient Instructions (Signed)
Will notify you  of labs when available.  Healthy weight loss  Needed.   Continue activity  No to little calories in  drinks . Change snacks to   100 - 150 calories without  Simple carbs .   rov depending on  Labs     6- 12 months  Or as needed      Health Maintenance, Male A healthy lifestyle and preventive care is important for your health and wellness. Ask your health care provider about what schedule of regular examinations is right for you. What should I know about weight and diet? Eat a Healthy Diet  Eat plenty of vegetables, fruits, whole grains, low-fat dairy products, and lean protein.  Do not eat a lot of foods high in solid fats, added sugars, or salt.  Maintain a Healthy Weight Regular exercise can help you achieve or maintain a healthy weight. You should:  Do at least 150 minutes of exercise each week. The exercise should increase your heart rate and make you sweat (moderate-intensity exercise).  Do strength-training exercises at least twice a week.  Watch Your Levels of Cholesterol and Blood Lipids  Have your blood tested for lipids and cholesterol every 5 years starting at 68 years of age. If you are at high risk for heart disease, you should start having your blood tested when you are 68 years old. You may need to have your cholesterol levels checked more often if: ? Your lipid or cholesterol levels are high. ? You are older than 68 years of age. ? You are at high risk for heart disease.  What should I know about cancer screening? Many types of cancers can be detected early and may often be prevented. Lung Cancer  You should be screened every year for lung cancer if: ? You are a current smoker who has smoked for at least 30 years. ? You are a former smoker who has quit within the past 15 years.  Talk to your health care provider about your screening options, when you should start screening, and how often you should be screened.  Colorectal  Cancer  Routine colorectal cancer screening usually begins at 68 years of age and should be repeated every 5-10 years until you are 68 years old. You may need to be screened more often if early forms of precancerous polyps or small growths are found. Your health care provider may recommend screening at an earlier age if you have risk factors for colon cancer.  Your health care provider may recommend using home test kits to check for hidden blood in the stool.  A small camera at the end of a tube can be used to examine your colon (sigmoidoscopy or colonoscopy). This checks for the earliest forms of colorectal cancer.  Prostate and Testicular Cancer  Depending on your age and overall health, your health care provider may do certain tests to screen for prostate and testicular cancer.  Talk to your health care provider about any symptoms or concerns you have about testicular or prostate cancer.  Skin Cancer  Check your skin from head to toe regularly.  Tell your health care provider about any new moles or changes in moles, especially if: ? There is a change in a mole's size, shape, or color. ? You have a mole that is larger than a pencil eraser.  Always use sunscreen. Apply sunscreen liberally and repeat throughout the day.  Protect yourself by wearing long sleeves, pants, a wide-brimmed hat, and sunglasses when outside.  What should I know about heart disease, diabetes, and high blood pressure?  If you are 29-67 years of age, have your blood pressure checked every 3-5 years. If you are 41 years of age or older, have your blood pressure checked every year. You should have your blood pressure measured twice-once when you are at a hospital or clinic, and once when you are not at a hospital or clinic. Record the average of the two measurements. To check your blood pressure when you are not at a hospital or clinic, you can use: ? An automated blood pressure machine at a pharmacy. ? A home blood  pressure monitor.  Talk to your health care provider about your target blood pressure.  If you are between 30-15 years old, ask your health care provider if you should take aspirin to prevent heart disease.  Have regular diabetes screenings by checking your fasting blood sugar level. ? If you are at a normal weight and have a low risk for diabetes, have this test once every three years after the age of 76. ? If you are overweight and have a high risk for diabetes, consider being tested at a younger age or more often.  A one-time screening for abdominal aortic aneurysm (AAA) by ultrasound is recommended for men aged 32-75 years who are current or former smokers. What should I know about preventing infection? Hepatitis B If you have a higher risk for hepatitis B, you should be screened for this virus. Talk with your health care provider to find out if you are at risk for hepatitis B infection. Hepatitis C Blood testing is recommended for:  Everyone born from [birth year] through 1966.  Anyone with known risk factors for hepatitis C.  Sexually Transmitted Diseases (STDs)  You should be screened each year for STDs including gonorrhea and chlamydia if: ? You are sexually active and are younger than 68 years of age. ? You are older than 68 years of age and your health care provider tells you that you are at risk for this type of infection. ? Your sexual activity has changed since you were last screened and you are at an increased risk for chlamydia or gonorrhea. Ask your health care provider if you are at risk.  Talk with your health care provider about whether you are at high risk of being infected with HIV. Your health care provider may recommend a prescription medicine to help prevent HIV infection.  What else can I do?  Schedule regular health, dental, and eye exams.  Stay current with your vaccines (immunizations).  Do not use any tobacco products, such as cigarettes, chewing tobacco, and  e-cigarettes. If you need help quitting, ask your health care provider.  Limit alcohol intake to no more than 2 drinks per day. One drink equals 12 ounces of beer, 5 ounces of wine, or 1 ounces of hard liquor.  Do not use street drugs.  Do not share needles.  Ask your health care provider for help if you need support or information about quitting drugs.  Tell your health care provider if you often feel depressed.  Tell your health care provider if you have ever been abused or do not feel safe at home. This information is not intended to replace advice given to you by your health care provider. Make sure you discuss any questions you have with your health care provider. Document Released: 07/06/2007 Document Revised: 09/06/2015 Document Reviewed: 10/11/2014 Elsevier Interactive Patient Education  [employer].

## 2017-06-09 ENCOUNTER — Other Ambulatory Visit: Payer: Self-pay | Admitting: Internal Medicine

## 2017-08-25 ENCOUNTER — Other Ambulatory Visit: Payer: Self-pay | Admitting: Internal Medicine

## 2017-08-29 ENCOUNTER — Encounter: Payer: Self-pay | Admitting: *Deleted

## 2017-08-29 ENCOUNTER — Ambulatory Visit (INDEPENDENT_AMBULATORY_CARE_PROVIDER_SITE_OTHER): Payer: BLUE CROSS/BLUE SHIELD

## 2017-08-29 ENCOUNTER — Ambulatory Visit: Payer: BLUE CROSS/BLUE SHIELD | Admitting: Internal Medicine

## 2017-08-29 ENCOUNTER — Encounter: Payer: Self-pay | Admitting: Internal Medicine

## 2017-08-29 VITALS — BP 138/72 | HR 108 | Temp 99.1°F | Wt 235.7 lb

## 2017-08-29 DIAGNOSIS — R509 Fever, unspecified: Secondary | ICD-10-CM

## 2017-08-29 DIAGNOSIS — R42 Dizziness and giddiness: Secondary | ICD-10-CM

## 2017-08-29 DIAGNOSIS — R6883 Chills (without fever): Secondary | ICD-10-CM

## 2017-08-29 DIAGNOSIS — I10 Essential (primary) hypertension: Secondary | ICD-10-CM

## 2017-08-29 DIAGNOSIS — M545 Low back pain: Secondary | ICD-10-CM

## 2017-08-29 LAB — CBC WITH DIFFERENTIAL/PLATELET
Basophils Absolute: 0.1 10*3/uL (ref 0.0–0.1)
Basophils Relative: 0.8 % (ref 0.0–3.0)
EOS ABS: 0 10*3/uL (ref 0.0–0.7)
EOS PCT: 0.1 % (ref 0.0–5.0)
HCT: 40.9 % (ref 39.0–52.0)
HEMOGLOBIN: 13.7 g/dL (ref 13.0–17.0)
LYMPHS ABS: 0.5 10*3/uL — AB (ref 0.7–4.0)
Lymphocytes Relative: 4.4 % — ABNORMAL LOW (ref 12.0–46.0)
MCHC: 33.5 g/dL (ref 30.0–36.0)
MCV: 85.1 fl (ref 78.0–100.0)
MONO ABS: 0.5 10*3/uL (ref 0.1–1.0)
Monocytes Relative: 4.9 % (ref 3.0–12.0)
NEUTROS PCT: 89.8 % — AB (ref 43.0–77.0)
Neutro Abs: 9.8 10*3/uL — ABNORMAL HIGH (ref 1.4–7.7)
Platelets: 269 10*3/uL (ref 150.0–400.0)
RBC: 4.8 Mil/uL (ref 4.22–5.81)
RDW: 15.1 % (ref 11.5–15.5)
WBC: 10.9 10*3/uL — ABNORMAL HIGH (ref 4.0–10.5)

## 2017-08-29 LAB — COMPREHENSIVE METABOLIC PANEL
ALT: 16 U/L (ref 0–53)
AST: 14 U/L (ref 0–37)
Albumin: 3.9 g/dL (ref 3.5–5.2)
Alkaline Phosphatase: 62 U/L (ref 39–117)
BUN: 22 mg/dL (ref 6–23)
CHLORIDE: 97 meq/L (ref 96–112)
CO2: 31 mEq/L (ref 19–32)
CREATININE: 1.3 mg/dL (ref 0.40–1.50)
Calcium: 9.6 mg/dL (ref 8.4–10.5)
GFR: 58.39 mL/min — ABNORMAL LOW (ref >60.00)
GLUCOSE: 115 mg/dL — AB (ref 70–99)
POTASSIUM: 3.9 meq/L (ref 3.5–5.1)
SODIUM: 139 meq/L (ref 135–145)
Total Bilirubin: 0.8 mg/dL (ref 0.2–1.2)
Total Protein: 7 g/dL (ref 6.0–8.3)

## 2017-08-29 LAB — POCT URINALYSIS DIPSTICK
Bilirubin, UA: 1
Glucose, UA: NEGATIVE
KETONES UA: 5
LEUKOCYTES UA: NEGATIVE
NITRITE UA: NEGATIVE
PROTEIN UA: POSITIVE — AB
RBC UA: 10
SPEC GRAV UA: 1.02 (ref 1.010–1.025)
UROBILINOGEN UA: 0.2 U/dL
pH, UA: 6 (ref 5.0–8.0)

## 2017-08-29 LAB — SEDIMENTATION RATE: Sed Rate: 43 mm/hr — ABNORMAL HIGH (ref 0–20)

## 2017-08-29 MED ORDER — CIPROFLOXACIN HCL 500 MG PO TABS: 500.0000 mg | ORAL_TABLET | Freq: Two times a day (BID) | ORAL | 0 refills | Status: DC

## 2017-08-29 NOTE — Patient Instructions (Addendum)
Exam is unrevealing chest x ray no  Obvious  pna but waiting ont he readings .    Chills and   Shakes usually associated with infection .  We can treat for uti  In the interim because of the sx having    Plan fluids rest and fu depending on how  doing    To Ed if progressive   Problem.   Pain etc  Plan fu visit next week     Depending on how doing.

## 2017-08-29 NOTE — Progress Notes (Signed)
Chief Complaint  Patient presents with  . Back Pain    Pt c/o back pain and dizziness x 1 month. Started as lower back pain. Pt also notes having kidney stones at the same time and the back pain seemed to resolve with the stones. Pt is an avid gym goer, about 3 times every week. Reports that pain in shoulders and back returned a few days ago. Pt states that he has been having chills/shaking, not sure if he has another kidney stone  Denies problems with urination    HPI: Celesta Aver Sr. 67 y.o. come in for  sda     For sx in past month and now  Acute sx last night.  Problems off an on for  Month See above  Lower back     Moving  Felt like    Stone.    Left abd pain and miveing and no pain now  Lasted about a week?   Went back to gym this week   Last night both shoulders  Felt like procupines  And shoulders  And had chills for 10 - 15 minutes  And very cold  And then better after tylenol.   Felt  light headed.      Today no syncope cp sob or palpitations .  No coughing shortness of breath in bed.  Sometims.  Deep breaths. Low back  Not as bad asa the abd pain.   ROS: See pertinent positives and negatives per HPI. No dysuria hematuria vomiting diarrhea blood in stool rash hx of tick bite  Others sick   Past Medical History:  Diagnosis Date  . Allergy   . Complication of anesthesia   . History of kidney stones   . History of renal stone   . Hx of bacterial pneumonia    hosp  2001  . Hx of diverticulitis of colon    hosp 2001  . Hyperlipidemia   . Hypertension   . Prostate CA ([place]) 10-24-11   7'13- bx. of prostate + cancer-surgery planned  . Prostate cancer ([place]) 10/31/2011    Family History  Problem Relation Age of Onset  . Diabetes Mother   . Colon cancer Father   . Cancer Father        colon  . Diabetes Sister        borderline  . Diabetes Brother        borderline  . ADD / ADHD Son     Social History   Socioeconomic History  . Marital status: Married    Spouse  name: Not on file  . Number of children: Not on file  . Years of education: Not on file  . Highest education level: Not on file  Occupational History  . Not on file  Social Needs  . Financial resource strain: Not on file  . Food insecurity:    Worry: Not on file    Inability: Not on file  . Transportation needs:    Medical: Not on file    Non-medical: Not on file  Tobacco Use  . Smoking status: Never Smoker  . Smokeless tobacco: Never Used  Substance and Sexual Activity  . Alcohol use: Yes    Comment: very use 1-2 per yr  . Drug use: No  . Sexual activity: Not Currently  Lifestyle  . Physical activity:    Days per week: Not on file    Minutes per session: Not on file  . Stress: Not on file  Relationships  . Social connections:    Talks on phone: Not on file    Gets together: Not on file    Attends religious service: Not on file    Active member of club or organization: Not on file    Attends meetings of clubs or organizations: Not on file    Relationship status: Not on file  Other Topics Concern  . 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    Accountant   2 cats and a dog    Outpatient Medications Prior to Visit  Medication Sig Dispense Refill  . atorvastatin (LIPITOR) 20 MG tablet TAKE 1 TABLET BY MOUTH EVERY DAY 90 tablet 1  . fluticasone (FLONASE) 50 MCG/ACT nasal spray PLACE 2 SPRAYS IN EACH NOSTRIL EVERY DAY 48 g prn  . metFORMIN (GLUCOPHAGE-XR) 500 MG 24 hr tablet Take 1 tablet (500 mg total) by mouth daily with breakfast. 90 tablet 0  . telmisartan-hydrochlorothiazide (MICARDIS HCT) 80-25 MG tablet Take 1 tablet by mouth daily. DUE FOR BP CHECK 90 tablet 1   No facility-administered medications prior to visit.      EXAM:  BP 138/72 (BP Location: Right Arm, Patient Position: Sitting, Cuff Size: Normal)   Pulse (!) 108   Temp 99.1 F (37.3 C) (Oral)   Wt 235 lb 11.2 oz (106.9 kg)   BMI 39.53 kg/m   Body mass index is  39.53 kg/m.  GENERAL: vitals reviewed and listed above, alert, oriented, appears well hydrated and in no acute distress non toxic but not feeling well  HEENT: atraumatic, conjunctiva  clear, no obvious abnormalities on inspection of external nose and ears OP : no lesion edema or exudate  NECK: no obvious masses on inspection palpation  LUNGS: clear to auscultation bilaterally, no wheezes, rales or rhonchi, good air movement CV: HRRR ocass  Skipped beat  , no clubbing cyanosis or  peripheral edema nl cap refill  Abdomen:  Sof,t normal bowel sounds without hepatosplenomegaly, no guarding rebound or masses no CVA tenderness  No pint tendenress on back  MS: moves all extremities without noticeable focal  abnormality PSYCH: pleasant and cooperative, no obvious depression or anxiety Lab Results  Component Value Date   WBC 8.0 06/05/2017   HGB 14.0 06/05/2017   HCT 42.1 06/05/2017   PLT 283.0 06/05/2017   GLUCOSE 98 06/05/2017   CHOL 159 06/05/2017   TRIG 173.0 (H) 06/05/2017   HDL 39.80 06/05/2017   LDLCALC 85 06/05/2017   ALT 18 06/05/2017   AST 17 06/05/2017   NA 139 06/05/2017   K 3.9 06/05/2017   CL 98 06/05/2017   CREATININE 1.11 06/05/2017   BUN 19 06/05/2017   CO2 32 06/05/2017   TSH 4.50 06/05/2017   PSA 0.01 (L) 06/05/2017   HGBA1C 6.7 (H) 06/05/2017   MICROALBUR 1.3 06/05/2017   BP Readings from Last 3 Encounters:  08/29/17 138/72  06/05/17 122/78  06/03/16 110/80   Wt Readings from Last 3 Encounters:  08/29/17 235 lb 11.2 oz (106.9 kg)  06/05/17 240 lb 1.6 oz (108.9 kg)  06/03/16 235 lb 12.8 oz (107 kg)   ekg  Sinus tach ocas pvc  No acute findings .  ua  Pos blood neg leuk  ASSESSMENT AND PLAN:  Discussed the following assessment and plan:  Shaking chills - Plan: DG Chest 2 View, Urine Culture, CBC with Differential/Platelet, CMP, Sedimentation rate, DG Chest 2 View  Low  back pain, unspecified back pain laterality, unspecified chronicity, with sciatica  presence unspecified - Plan: POC Urinalysis Dipstick, Urine Culture, CBC with Differential/Platelet, CMP, Sedimentation rate  Dizzy - Plan: DG Chest 2 View, Urine Culture, EKG 12-Lead, CBC with Differential/Platelet, CMP, Sedimentation rate, DG Chest 2 View  Essential hypertension - Plan: EKG 12-Lead, CBC with Differential/Platelet, CMP, Sedimentation rate  Fever, unspecified fever cause Hx cw infection ?  Concern about bacterial   But no obv source  X hx of abd sx and stone sx   A few weeks ago .   Suppose could be viral but   At risk bacterial cause.   No tick bite  Exposures  Etc .  Weekend coming  Lab today  And and empiric  rx for uti   Benefit more than risk of medications  to continue.  as he has had hospitalized w  stone and divertic  in past .   c xrya pending no  obv pna  -Patient advised to return or notify health care team  if  new concerns arise.  Patient Instructions  Exam is unrevealing chest x ray no  Obvious  pna but waiting ont he readings .    Chills and   Shakes usually associated with infection .  We can treat for uti  In the interim because of the sx having    Plan fluids rest and fu depending on how  doing    To Ed if progressive   Problem.   Pain etc  Plan fu visit next week     Depending on how doing.              Standley Brooking. Cirilo Canner [name]

## 2017-08-31 LAB — URINE CULTURE
MICRO NUMBER:: 90945886
Result:: NO GROWTH
SPECIMEN QUALITY: ADEQUATE

## 2017-09-01 ENCOUNTER — Ambulatory Visit: Payer: BLUE CROSS/BLUE SHIELD | Admitting: Internal Medicine

## 2017-09-03 NOTE — Telephone Encounter (Signed)
Pt aware that his next scheduled OV is 12/01/17 Nothing further needed.

## 2017-09-09 ENCOUNTER — Other Ambulatory Visit: Payer: Self-pay | Admitting: Internal Medicine

## 2017-10-02 DIAGNOSIS — H40013 Open angle with borderline findings, low risk, bilateral: Secondary | ICD-10-CM | POA: Diagnosis not present

## 2017-11-03 ENCOUNTER — Telehealth: Payer: Self-pay | Admitting: Internal Medicine

## 2017-11-03 NOTE — Telephone Encounter (Signed)
Copied from [place] [id number]. Topic: Quick Communication - Rx Refill/Question >> Nov 03, 2017  8:58 AM Margot Ables wrote: Medication:  pharmacy requesting change back to valsartan-hydrochlorothiazide (DIOVAN-HCT) 320-25 MG tablet  per tablet that is back in stock The pharmacy states pts insurance changed reimbursement on telmisartan-hydrochlorothiazide (MICARDIS HCT) 80-25 MG tablet and they took a $60 loss so pt did not run out of meds. He just picked up 90 day supply but wanting new RX for next fill.  Preferred Pharmacy (with phone number or street name): [address] [place] - [place], [place] - [address] [phone] (Phone) [phone] (Fax)

## 2017-11-04 NOTE — Telephone Encounter (Signed)
Dr Regis Bill are you wanting to switch the patient back to Diovan or stick with the Micardis for now?

## 2017-11-06 NOTE — Telephone Encounter (Signed)
Ok to switch back to Diovan hctz for next rx   If effective and less expensive

## 2017-11-10 NOTE — Telephone Encounter (Signed)
I called [facility] and informed Dawn of the message below.

## 2017-11-24 ENCOUNTER — Other Ambulatory Visit: Payer: Self-pay | Admitting: Internal Medicine

## 2017-12-01 ENCOUNTER — Encounter: Payer: Self-pay | Admitting: Internal Medicine

## 2017-12-01 ENCOUNTER — Ambulatory Visit: Payer: BLUE CROSS/BLUE SHIELD | Admitting: Internal Medicine

## 2017-12-01 VITALS — BP 120/80 | HR 93 | Temp 98.6°F | Wt 235.8 lb

## 2017-12-01 DIAGNOSIS — Z79899 Other long term (current) drug therapy: Secondary | ICD-10-CM | POA: Diagnosis not present

## 2017-12-01 DIAGNOSIS — E669 Obesity, unspecified: Secondary | ICD-10-CM

## 2017-12-01 DIAGNOSIS — R7301 Impaired fasting glucose: Secondary | ICD-10-CM | POA: Diagnosis not present

## 2017-12-01 DIAGNOSIS — Z23 Encounter for immunization: Secondary | ICD-10-CM | POA: Diagnosis not present

## 2017-12-01 DIAGNOSIS — I10 Essential (primary) hypertension: Secondary | ICD-10-CM | POA: Diagnosis not present

## 2017-12-01 LAB — POCT GLYCOSYLATED HEMOGLOBIN (HGB A1C): Hemoglobin A1C: 6.3 % — AB (ref 4.0–5.6)

## 2017-12-01 NOTE — Progress Notes (Signed)
Chief Complaint  Patient presents with  . Follow-up    HPI: Daniel BOHANON Sr. 67 y.o. come in for Chronic disease management   bp and blood sugar   Had lost eight but up some at birthday week  Tolerating  Metformin fine  bp whas been ok  No new medical sx  ROS: See pertinent positives and negatives per HPI.  Past Medical History:  Diagnosis Date  . Allergy   . Complication of anesthesia   . History of kidney stones   . History of renal stone   . Hx of bacterial pneumonia    hosp  2001  . Hx of diverticulitis of colon    hosp 2001  . Hyperlipidemia   . Hypertension   . Prostate CA ([place]) 10-24-11   7'13- bx. of prostate + cancer-surgery planned  . Prostate cancer ([place]) 10/31/2011    Family History  Problem Relation Age of Onset  . Diabetes Mother   . Colon cancer Father   . Cancer Father        colon  . Diabetes Sister        borderline  . Diabetes Brother        borderline  . ADD / ADHD Son     Social History   Socioeconomic History  . Marital status: Married    Spouse name: Not on file  . Number of children: Not on file  . Years of education: Not on file  . Highest education level: Not on file  Occupational History  . Not on file  Social Needs  . Financial resource strain: Not on file  . Food insecurity:    Worry: Not on file    Inability: Not on file  . Transportation needs:    Medical: Not on file    Non-medical: Not on file  Tobacco Use  . Smoking status: Never Smoker  . Smokeless tobacco: Never Used  Substance and Sexual Activity  . Alcohol use: Yes    Comment: very use 1-2 per yr  . Drug use: No  . Sexual activity: Not Currently  Lifestyle  . Physical activity:    Days per week: Not on file    Minutes per session: Not on file  . Stress: Not on file  Relationships  . Social connections:    Talks on phone: Not on file    Gets together: Not on file    Attends religious service: Not on file    Active member of club or organization:  Not on file    Attends meetings of clubs or organizations: Not on file    Relationship status: Not on file  Other Topics Concern  . 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    Accountant   2 cats and a dog    Outpatient Medications Prior to Visit  Medication Sig Dispense Refill  . atorvastatin (LIPITOR) 20 MG tablet TAKE 1 TABLET BY MOUTH EVERY DAY 90 tablet 1  . fluticasone (FLONASE) 50 MCG/ACT nasal spray PLACE 2 SPRAYS IN EACH NOSTRIL EVERY DAY 48 g prn  . metFORMIN (GLUCOPHAGE-XR) 500 MG 24 hr tablet Take 1 tablet (500 mg total) by mouth daily with breakfast. 90 tablet 0  . telmisartan-hydrochlorothiazide (MICARDIS HCT) 80-25 MG tablet Take 1 tablet by mouth daily. DUE FOR BP CHECK 90 tablet 1  . ciprofloxacin (CIPRO) 500 MG tablet Take 1 tablet (500 mg total) by  mouth 2 (two) times daily. 14 tablet 0   No facility-administered medications prior to visit.      EXAM:  BP 120/80 (BP Location: Right Arm, Patient Position: Sitting, Cuff Size: Large)   Pulse 93   Temp 98.6 F (37 C) (Oral)   Wt 235 lb 12.8 oz (107 kg)   SpO2 95%   BMI 39.54 kg/m   Body mass index is 39.54 kg/m.  GENERAL: vitals reviewed and listed above, alert, oriented, appears well hydrated and in no acute distress  MS: moves all extremities without noticeable focal  abnormality PSYCH: pleasant and cooperative, no obvious depression or anxiety Lab Results  Component Value Date   WBC 10.9 (H) 08/29/2017   HGB 13.7 08/29/2017   HCT 40.9 08/29/2017   PLT 269.0 08/29/2017   GLUCOSE 115 (H) 08/29/2017   CHOL 159 06/05/2017   TRIG 173.0 (H) 06/05/2017   HDL 39.80 06/05/2017   LDLCALC 85 06/05/2017   ALT 16 08/29/2017   AST 14 08/29/2017   NA 139 08/29/2017   K 3.9 08/29/2017   CL 97 08/29/2017   CREATININE 1.30 08/29/2017   BUN 22 08/29/2017   CO2 31 08/29/2017   TSH 4.50 06/05/2017   PSA 0.01 (L) 06/05/2017   HGBA1C 6.3 (A) 12/01/2017   MICROALBUR 1.3  06/05/2017   BP Readings from Last 3 Encounters:  12/01/17 120/80  08/29/17 138/72  06/05/17 122/78   Wt Readings from Last 3 Encounters:  12/01/17 235 lb 12.8 oz (107 kg)  08/29/17 235 lb 11.2 oz (106.9 kg)  06/05/17 240 lb 1.6 oz (108.9 kg)    ASSESSMENT AND PLAN:  Discussed the following assessment and plan:  Fasting hyperglycemia - Plan: POCT glycosylated hemoglobin (Hb A1C)  Medication management  Essential hypertension  Needs flu shot - Plan: Flu vaccine HIGH DOSE PF (Fluzone High dose)  Obesity (BMI 30-39.9) 6 mos cpx with labs at that time -Patient advised to return or notify health care team  if  new concerns arise.  Patient Instructions  Continue lifestyle intervention healthy eating and exercise .  cpx in   6 mos  .     Standley Brooking. Jessabelle Markiewicz [name]

## 2017-12-01 NOTE — Patient Instructions (Addendum)
Continue lifestyle intervention healthy eating and exercise .  cpx in   6 mos  .

## 2017-12-08 ENCOUNTER — Other Ambulatory Visit: Payer: Self-pay | Admitting: Internal Medicine

## 2018-02-19 ENCOUNTER — Other Ambulatory Visit: Payer: Self-pay | Admitting: *Deleted

## 2018-02-19 MED ORDER — TELMISARTAN 80 MG PO TABS: 80.0000 mg | ORAL_TABLET | Freq: Every day | ORAL | 1 refills | Status: DC

## 2018-02-19 MED ORDER — HYDROCHLOROTHIAZIDE 25 MG PO TABS: 25.0000 mg | ORAL_TABLET | Freq: Every day | ORAL | 1 refills | Status: DC

## 2018-02-19 NOTE — Telephone Encounter (Signed)
telmisartan-hydrochlorothiazide (MICARDIS HCT) 80-25 MG tablet  Pharmacy requesting 2 separate prescriptions.  Okay per Dr Regis Bill.

## 2018-03-09 DIAGNOSIS — H40053 Ocular hypertension, bilateral: Secondary | ICD-10-CM | POA: Diagnosis not present

## 2018-03-10 ENCOUNTER — Other Ambulatory Visit: Payer: Self-pay | Admitting: Physician Assistant

## 2018-03-10 DIAGNOSIS — L570 Actinic keratosis: Secondary | ICD-10-CM | POA: Diagnosis not present

## 2018-03-10 DIAGNOSIS — D235 Other benign neoplasm of skin of trunk: Secondary | ICD-10-CM | POA: Diagnosis not present

## 2018-03-24 ENCOUNTER — Other Ambulatory Visit: Payer: Self-pay | Admitting: Internal Medicine

## 2018-05-21 ENCOUNTER — Other Ambulatory Visit: Payer: Self-pay | Admitting: Internal Medicine

## 2018-06-08 ENCOUNTER — Encounter: Payer: BLUE CROSS/BLUE SHIELD | Admitting: Internal Medicine

## 2018-07-08 DIAGNOSIS — H0102B Squamous blepharitis left eye, upper and lower eyelids: Secondary | ICD-10-CM | POA: Diagnosis not present

## 2018-07-08 DIAGNOSIS — H0102A Squamous blepharitis right eye, upper and lower eyelids: Secondary | ICD-10-CM | POA: Diagnosis not present

## 2018-07-08 DIAGNOSIS — H40013 Open angle with borderline findings, low risk, bilateral: Secondary | ICD-10-CM | POA: Diagnosis not present

## 2018-07-08 DIAGNOSIS — H2513 Age-related nuclear cataract, bilateral: Secondary | ICD-10-CM | POA: Diagnosis not present

## 2018-08-21 ENCOUNTER — Other Ambulatory Visit: Payer: Self-pay | Admitting: Internal Medicine

## 2018-08-24 NOTE — Progress Notes (Signed)
Chief Complaint  Patient presents with  . Annual Exam    no concerns     HPI: Patient  Daniel BEADNELL Sr.  68 y.o. comes in today for Preventive Health Care visit  And Chronic disease management   BP seems ok taking meds relmisartan and hctz  BG taking metformin bid  No numbness tingling  Vision x   To have cataract  Left eye  Surgery september.   Needs refill flpnase/   No recurrent renal stones gi gu changes  No linger seeing urologist   Not sure when colon is due  fam hx colon cancer  Father  5-7 years? Recall last 2 were clean  HLD taking med no problem noted   Health Maintenance  Topic Date Due  . Hepatitis C Screening  [birth year]  . COLONOSCOPY  04/12/2017  . HEMOGLOBIN A1C  06/01/2018  . INFLUENZA VACCINE  08/22/2018  . PNA vac Low Risk Adult (2 of 2 - PPSV23) 08/25/2019 (Originally 06/03/2017)  . OPHTHALMOLOGY EXAM  02/24/2019  . FOOT EXAM  08/25/2019  . TETANUS/TDAP  05/13/2022   Health Maintenance Review LIFESTYLE:  Exercise:   Beginning some walking recnetly but was off.  [employer].  Some limited.  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   ROS:  See  hpi  GEN/ HEENT: No fever, significant weight changes sweats headaches vision problems hearing changes, CV/ PULM; No chest pain shortness of breath cough, syncope,edema  change in exercise tolerance. GI /GU: No adominal pain, vomiting, change in bowel habits. No blood in the stool. No significant GU symptoms. SKIN/HEME: ,no acute skin rashes suspicious lesions or bleeding. No lymphadenopathy, nodules, masses.  NEURO/ PSYCH:  No neurologic signs such as weakness numbness. No depression anxiety. IMM/ Allergy: No unusual infections.  Allergy .   REST of 12 system review negative except as per HPI   Past Medical History:  Diagnosis Date  . Allergy   . Complication of  anesthesia   . History of kidney stones   . History of renal stone   . Hx of bacterial pneumonia    hosp  2001  . Hx of diverticulitis of colon    hosp 2001  . Hyperlipidemia   . Hypertension   . Prostate CA ([place]) 10-24-11   7'13- bx. of prostate + cancer-surgery planned  . Prostate cancer ([place]) 10/31/2011    Past Surgical History:  Procedure Laterality Date  . EXTRACORPOREAL SHOCK WAVE LITHOTRIPSY Left 05/30/2016   Procedure: LEFT EXTRACORPOREAL SHOCK WAVE LITHOTRIPSY (ESWL);  Surgeon: Cleon Gustin, MD;  Location: [facility];  Service: Urology;  Laterality: Left;  . ROBOT ASSISTED LAPAROSCOPIC RADICAL PROSTATECTOMY  10/28/2011   Procedure: ROBOTIC ASSISTED LAPAROSCOPIC RADICAL PROSTATECTOMY;  Surgeon: Malka So, MD;  Location: [facility];  Service: Urology;  Laterality: N/A;  . SHOULDER SURGERY     lt. arthroscopy  . WISDOM TOOTH EXTRACTION  10-24-11   x2 extracted    Family History  Problem Relation Age of Onset  . Diabetes Mother   . Colon cancer Father   . Cancer Father        colon  . Diabetes Sister        borderline  . Diabetes Brother  borderline  . ADD / ADHD Son     Social History   Socioeconomic History  . Marital status: Married    Spouse name: Not on file  . Number of children: Not on file  . Years of education: Not on file  . Highest education level: Not on file  Occupational History  . Not on file  Social Needs  . Financial resource strain: Not on file  . Food insecurity    Worry: Not on file    Inability: Not on file  . Transportation needs    Medical: Not on file    Non-medical: Not on file  Tobacco Use  . Smoking status: Never Smoker  . Smokeless tobacco: Never Used  Substance and Sexual Activity  . Alcohol use: Yes    Comment: very use 1-2 per yr  . Drug use: No  . Sexual activity: Not Currently  Lifestyle  . Physical activity    Days per week: Not on file    Minutes per session: Not on file  . Stress: Not on file   Relationships  . Social Herbalist on phone: Not on file    Gets together: Not on file    Attends religious service: Not on file    Active member of club or organization: Not on file    Attends meetings of clubs or organizations: Not on file    Relationship status: Not on file  Other Topics Concern  . 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    Accountant   2 cats and a dog    Outpatient Medications Prior to Visit  Medication Sig Dispense Refill  . atorvastatin (LIPITOR) 20 MG tablet TAKE 1 TABLET BY MOUTH EVERY DAY 90 tablet 1  . hydrochlorothiazide (HYDRODIURIL) 25 MG tablet TAKE 1 TABLET BY MOUTH EVERY DAY 90 tablet 1  . metFORMIN (GLUCOPHAGE-XR) 500 MG 24 hr tablet TAKE 1 TABLET BY MOUTH EVERY DAY WITH BREAKFAST 90 tablet 5  . telmisartan (MICARDIS) 80 MG tablet TAKE 1 TABLET BY MOUTH EVERY DAY 90 tablet 1  . fluticasone (FLONASE) 50 MCG/ACT nasal spray PLACE 2 SPRAYS IN EACH NOSTRIL EVERY DAY 48 g PRN   No facility-administered medications prior to visit.      EXAM:  BP 138/80 (BP Location: Right Arm, Patient Position: Sitting, Cuff Size: Large)   Pulse 86   Temp 98.2 F (36.8 C) (Temporal)   Ht 5\' 5"  (1.651 m)   Wt 239 lb 3.2 oz (108.5 kg)   SpO2 95%   BMI 39.80 kg/m   Body mass index is 39.8 kg/m. Wt Readings from Last 3 Encounters:  08/25/18 239 lb 3.2 oz (108.5 kg)  12/01/17 235 lb 12.8 oz (107 kg)  08/29/17 235 lb 11.2 oz (106.9 kg)    Physical Exam: Vital signs reviewed DGL:OVFI is a well-developed well-nourished alert cooperative    who appearsr stated age in no acute distress.  HEENT: normocephalic atraumatic , Eyes: PERRL EOM's full, conjunctiva clear, Nares: paten,t no deformity discharge or tenderness., Ears: no deformity EAC's clear TMs with normal landmarks. Mouth: clear OP, no lesions, edema.  Moist mucous membranes. Dentition in adequate repair. NECK: supple without masses, thyromegaly or bruits.  CHEST/PULM:  Clear to auscultation and percussion breath sounds equal no wheeze , rales or rhonchi. No chest wall deformities or tenderness. CV: PMI is nondisplaced, S1 S2 no gallops, murmurs, rubs. Peripheral pulses  are full without delay.No JVD .  ABDOMEN: Bowel sounds normal nontender  No guard or rebound, no hepato splenomegal no CVA tenderness.  No hernia. Extremtities:  No clubbing cyanosis or edema, no acute joint swelling or redness no focal atrophy NEURO:  Oriented x3, cranial nerves 3-12 appear to be intact, no obvious focal weakness,gait within normal limits no abnormal reflexes or asymmetrical SKIN: No acute rashes normal turgor, color, no bruising or petechiae. PSYCH: Oriented, good eye contact, no obvious depression anxiety, cognition and judgment appear normal. LN: no cervical axillary inguinal adenopathy Diabetic Foot Exam - Simple   Simple Foot Form Diabetic Foot exam was performed with the following findings: Yes 08/25/2018  9:40 AM  Visual Inspection No deformities, no ulcerations, no other skin breakdown bilaterally: Yes Sensation Testing Intact to touch and monofilament testing bilaterally: Yes Pulse Check Posterior Tibialis and Dorsalis pulse intact bilaterally: Yes Comments     Lab Results  Component Value Date   WBC 10.9 (H) 08/29/2017   HGB 13.7 08/29/2017   HCT 40.9 08/29/2017   PLT 269.0 08/29/2017   GLUCOSE 115 (H) 08/29/2017   CHOL 159 06/05/2017   TRIG 173.0 (H) 06/05/2017   HDL 39.80 06/05/2017   LDLCALC 85 06/05/2017   ALT 16 08/29/2017   AST 14 08/29/2017   NA 139 08/29/2017   K 3.9 08/29/2017   CL 97 08/29/2017   CREATININE 1.30 08/29/2017   BUN 22 08/29/2017   CO2 31 08/29/2017   TSH 4.50 06/05/2017   PSA 0.01 (L) 06/05/2017   HGBA1C 6.3 (A) 12/01/2017   MICROALBUR 1.3 06/05/2017    BP Readings from Last 3 Encounters:  08/25/18 138/80  12/01/17 120/80  08/29/17 138/72   Ate last night .    Lab results reviewed with patient    ASSESSMENT AND PLAN:  Discussed the following assessment and plan:    ICD-10-CM   1. Visit for preventive health examination  Z66.06 Basic metabolic panel    CBC with Differential/Platelet    Hemoglobin A1c    Hepatic function panel    Lipid panel    TSH    PSA    Microalbumin / creatinine urine ratio  2. Medication management  T01.601 Basic metabolic panel    CBC with Differential/Platelet    Hemoglobin A1c    Hepatic function panel    Lipid panel    TSH    PSA    Microalbumin / creatinine urine ratio  3. Essential hypertension  U93 Basic metabolic panel    CBC with Differential/Platelet    Hemoglobin A1c    Hepatic function panel    Lipid panel    TSH    PSA    Microalbumin / creatinine urine ratio  4. Fasting hyperglycemia  A35.57 Basic metabolic panel    CBC with Differential/Platelet    Hemoglobin A1c    Hepatic function panel    Lipid panel    TSH    PSA    Microalbumin / creatinine urine ratio   in diabetic range but now controlled on metformin  5. Obesity (BMI 30-39.9)  D22.0 Basic metabolic panel    CBC with Differential/Platelet    Hemoglobin A1c    Hepatic function panel    Lipid panel    TSH    PSA    Microalbumin / creatinine urine ratio  6. Hyperlipidemia, unspecified hyperlipidemia type  U54.2 Basic metabolic panel    CBC with Differential/Platelet    Hemoglobin A1c    Hepatic function  panel    Lipid panel    TSH    PSA    Microalbumin / creatinine urine ratio  7. Personal history of prostate cancer  O35.00 Basic metabolic panel    CBC with Differential/Platelet    Hemoglobin A1c    Hepatic function panel    Lipid panel    TSH    PSA    Microalbumin / creatinine urine ratio   deferred pneumovax 23 booster today  Disc shingrix     Disc get weight  back down  Patient Care Team: Asheton Scheffler, Standley Brooking, MD as PCP - General McKenzie, Candee Furbish, MD as Consulting Physician (Urology) Patient Instructions   Intensify lifestyle interventions.   Get  weight down as possible.  To help     Will notify you  of labs when available.   Due for booster pneumovax 23   Update shingrix vaccine can wait until  5 years out of last vaccine.  Of any time .   Colon cancer screening  Contact  GI department to see when due by medical guidelines .      Health Maintenance, Male Adopting a healthy lifestyle and getting preventive care are important in promoting health and wellness. Ask your health care provider about:  The right schedule for you to have regular tests and exams.  Things you can do on your own to prevent diseases and keep yourself healthy. What should I know about diet, weight, and exercise? Eat a healthy diet   Eat a diet that includes plenty of vegetables, fruits, low-fat dairy products, and lean protein.  Do not eat a lot of foods that are high in solid fats, added sugars, or sodium. Maintain a healthy weight Body mass index (BMI) is a measurement that can be used to identify possible weight problems. It estimates body fat based on height and weight. Your health care provider can help determine your BMI and help you achieve or maintain a healthy weight. Get regular exercise Get regular exercise. This is one of the most important things you can do for your health. Most adults should:  Exercise for at least 150 minutes each week. The exercise should increase your heart rate and make you sweat (moderate-intensity exercise).  Do strengthening exercises at least twice a week. This is in addition to the moderate-intensity exercise.  Spend less time sitting. Even light physical activity can be beneficial. Watch cholesterol and blood lipids Have your blood tested for lipids and cholesterol at 69 years of age, then have this test every 5 years. You may need to have your cholesterol levels checked more often if:  Your lipid or cholesterol levels are high.  You are older than 69 years of age.  You are at high risk for heart  disease. What should I know about cancer screening? Many types of cancers can be detected early and may often be prevented. Depending on your health history and family history, you may need to have cancer screening at various ages. This may include screening for:  Colorectal cancer.  Prostate cancer.  Skin cancer.  Lung cancer. What should I know about heart disease, diabetes, and high blood pressure? Blood pressure and heart disease  High blood pressure causes heart disease and increases the risk of stroke. This is more likely to develop in people who have high blood pressure readings, are of African descent, or are overweight.  Talk with your health care provider about your target blood pressure readings.  Have your blood pressure  checked: ? Every 3-5 years if you are 74-65 years of age. ? Every year if you are 67 years old or older.  If you are between the ages of 27 and 8 and are a current or former smoker, ask your health care provider if you should have a one-time screening for abdominal aortic aneurysm (AAA). Diabetes Have regular diabetes screenings. This checks your fasting blood sugar level. Have the screening done:  Once every three years after age 81 if you are at a normal weight and have a low risk for diabetes.  More often and at a younger age if you are overweight or have a high risk for diabetes. What should I know about preventing infection? Hepatitis B If you have a higher risk for hepatitis B, you should be screened for this virus. Talk with your health care provider to find out if you are at risk for hepatitis B infection. Hepatitis C Blood testing is recommended for:  Everyone born from [birth year] through 1966.  Anyone with known risk factors for hepatitis C. Sexually transmitted infections (STIs)  You should be screened each year for STIs, including gonorrhea and chlamydia, if: ? You are sexually active and are younger than 69 years of age. ? You are older  than 69 years of age and your health care provider tells you that you are at risk for this type of infection. ? Your sexual activity has changed since you were last screened, and you are at increased risk for chlamydia or gonorrhea. Ask your health care provider if you are at risk.  Ask your health care provider about whether you are at high risk for HIV. Your health care provider may recommend a prescription medicine to help prevent HIV infection. If you choose to take medicine to prevent HIV, you should first get tested for HIV. You should then be tested every 3 months for as long as you are taking the medicine. Follow these instructions at home: Lifestyle  Do not use any products that contain nicotine or tobacco, such as cigarettes, e-cigarettes, and chewing tobacco. If you need help quitting, ask your health care provider.  Do not use street drugs.  Do not share needles.  Ask your health care provider for help if you need support or information about quitting drugs. Alcohol use  Do not drink alcohol if your health care provider tells you not to drink.  If you drink alcohol: ? Limit how much you have to 0-2 drinks a day. ? Be aware of how much alcohol is in your drink. In the [place] one drink equals one 12 oz bottle of beer (355 mL), one 5 oz glass of wine (148 mL), or one 1 oz glass of hard liquor (44 mL). General instructions  Schedule regular health, dental, and eye exams.  Stay current with your vaccines.  Tell your health care provider if: ? You often feel depressed. ? You have ever been abused or do not feel safe at home. Summary  Adopting a healthy lifestyle and getting preventive care are important in promoting health and wellness.  Follow your health care provider's instructions about healthy diet, exercising, and getting tested or screened for diseases.  Follow your health care provider's instructions on monitoring your cholesterol and blood pressure. This  information is not intended to replace advice given to you by your health care provider. Make sure you discuss any questions you have with your health care provider. Document Released: 07/06/2007 Document Revised: 12/31/2017 Document Reviewed: 12/31/2017 Elsevier  Patient Education  2020 [place] Rudine Rieger [name]

## 2018-08-25 ENCOUNTER — Encounter: Payer: Self-pay | Admitting: Internal Medicine

## 2018-08-25 ENCOUNTER — Other Ambulatory Visit: Payer: Self-pay

## 2018-08-25 ENCOUNTER — Ambulatory Visit: Payer: BLUE CROSS/BLUE SHIELD | Admitting: Internal Medicine

## 2018-08-25 VITALS — BP 138/80 | HR 86 | Temp 98.2°F | Ht 65.0 in | Wt 239.2 lb

## 2018-08-25 DIAGNOSIS — Z0000 Encounter for general adult medical examination without abnormal findings: Secondary | ICD-10-CM | POA: Diagnosis not present

## 2018-08-25 DIAGNOSIS — I10 Essential (primary) hypertension: Secondary | ICD-10-CM | POA: Diagnosis not present

## 2018-08-25 DIAGNOSIS — Z8546 Personal history of malignant neoplasm of prostate: Secondary | ICD-10-CM | POA: Diagnosis not present

## 2018-08-25 DIAGNOSIS — E669 Obesity, unspecified: Secondary | ICD-10-CM

## 2018-08-25 DIAGNOSIS — Z79899 Other long term (current) drug therapy: Secondary | ICD-10-CM

## 2018-08-25 DIAGNOSIS — E785 Hyperlipidemia, unspecified: Secondary | ICD-10-CM

## 2018-08-25 DIAGNOSIS — R7301 Impaired fasting glucose: Secondary | ICD-10-CM

## 2018-08-25 LAB — CBC WITH DIFFERENTIAL/PLATELET
Basophils Absolute: 0 10*3/uL (ref 0.0–0.1)
Basophils Relative: 0.6 % (ref 0.0–3.0)
Eosinophils Absolute: 0.3 10*3/uL (ref 0.0–0.7)
Eosinophils Relative: 4.2 % (ref 0.0–5.0)
HCT: 42.9 % (ref 39.0–52.0)
Hemoglobin: 14.3 g/dL (ref 13.0–17.0)
Lymphocytes Relative: 26.2 % (ref 12.0–46.0)
Lymphs Abs: 1.9 10*3/uL (ref 0.7–4.0)
MCHC: 33.3 g/dL (ref 30.0–36.0)
MCV: 87 fl (ref 78.0–100.0)
Monocytes Absolute: 0.8 10*3/uL (ref 0.1–1.0)
Monocytes Relative: 10.8 % (ref 3.0–12.0)
Neutro Abs: 4.2 10*3/uL (ref 1.4–7.7)
Neutrophils Relative %: 58.2 % (ref 43.0–77.0)
Platelets: 286 10*3/uL (ref 150.0–400.0)
RBC: 4.93 Mil/uL (ref 4.22–5.81)
RDW: 14.2 % (ref 11.5–15.5)
WBC: 7.2 10*3/uL (ref 4.0–10.5)

## 2018-08-25 LAB — LIPID PANEL
Cholesterol: 156 mg/dL (ref 0–200)
HDL: 38.3 mg/dL — ABNORMAL LOW (ref >39.00)
LDL Cholesterol: 86 mg/dL (ref 0–99)
NonHDL: 117.43
Total CHOL/HDL Ratio: 4
Triglycerides: 156 mg/dL — ABNORMAL HIGH (ref 0.0–149.0)
VLDL: 31.2 mg/dL (ref 0.0–40.0)

## 2018-08-25 LAB — MICROALBUMIN / CREATININE URINE RATIO
Creatinine,U: 196.8 mg/dL
Microalb Creat Ratio: 1.3 mg/g (ref 0.0–30.0)
Microalb, Ur: 2.6 mg/dL — ABNORMAL HIGH (ref 0.0–1.9)

## 2018-08-25 LAB — BASIC METABOLIC PANEL
BUN: 16 mg/dL (ref 6–23)
CO2: 29 mEq/L (ref 19–32)
Calcium: 9.2 mg/dL (ref 8.4–10.5)
Chloride: 99 mEq/L (ref 96–112)
Creatinine, Ser: 1.01 mg/dL (ref 0.40–1.50)
GFR: 73.3 mL/min (ref >60.00)
Glucose, Bld: 113 mg/dL — ABNORMAL HIGH (ref 70–99)
Potassium: 3.7 mEq/L (ref 3.5–5.1)
Sodium: 138 mEq/L (ref 135–145)

## 2018-08-25 LAB — HEPATIC FUNCTION PANEL
ALT: 21 U/L (ref 0–53)
AST: 16 U/L (ref 0–37)
Albumin: 4.1 g/dL (ref 3.5–5.2)
Alkaline Phosphatase: 64 U/L (ref 39–117)
Bilirubin, Direct: 0.1 mg/dL (ref 0.0–0.3)
Total Bilirubin: 0.5 mg/dL (ref 0.2–1.2)
Total Protein: 7.1 g/dL (ref 6.0–8.3)

## 2018-08-25 LAB — HEMOGLOBIN A1C: Hgb A1c MFr Bld: 7.1 % — ABNORMAL HIGH (ref 4.6–6.5)

## 2018-08-25 LAB — PSA: PSA: 0.01 ng/mL — ABNORMAL LOW (ref 0.10–4.00)

## 2018-08-25 LAB — TSH: TSH: 4.75 u[IU]/mL — ABNORMAL HIGH (ref 0.35–4.50)

## 2018-08-25 MED ORDER — FLUTICASONE PROPIONATE 50 MCG/ACT NA SUSP: NASAL | 12 refills | Status: DC

## 2018-08-25 NOTE — Patient Instructions (Addendum)
Intensify lifestyle interventions.   Get weight down as possible.  To help     Will notify you  of labs when available.   Due for booster pneumovax 23   Update shingrix vaccine can wait until  5 years out of last vaccine.  Of any time .   Colon cancer screening  Contact  GI department to see when due by medical guidelines .      Health Maintenance, Male Adopting a healthy lifestyle and getting preventive care are important in promoting health and wellness. Ask your health care provider about:  The right schedule for you to have regular tests and exams.  Things you can do on your own to prevent diseases and keep yourself healthy. What should I know about diet, weight, and exercise? Eat a healthy diet   Eat a diet that includes plenty of vegetables, fruits, low-fat dairy products, and lean protein.  Do not eat a lot of foods that are high in solid fats, added sugars, or sodium. Maintain a healthy weight Body mass index (BMI) is a measurement that can be used to identify possible weight problems. It estimates body fat based on height and weight. Your health care provider can help determine your BMI and help you achieve or maintain a healthy weight. Get regular exercise Get regular exercise. This is one of the most important things you can do for your health. Most adults should:  Exercise for at least 150 minutes each week. The exercise should increase your heart rate and make you sweat (moderate-intensity exercise).  Do strengthening exercises at least twice a week. This is in addition to the moderate-intensity exercise.  Spend less time sitting. Even light physical activity can be beneficial. Watch cholesterol and blood lipids Have your blood tested for lipids and cholesterol at 69 years of age, then have this test every 5 years. You may need to have your cholesterol levels checked more often if:  Your lipid or cholesterol levels are high.  You are older than 69 years of  age.  You are at high risk for heart disease. What should I know about cancer screening? Many types of cancers can be detected early and may often be prevented. Depending on your health history and family history, you may need to have cancer screening at various ages. This may include screening for:  Colorectal cancer.  Prostate cancer.  Skin cancer.  Lung cancer. What should I know about heart disease, diabetes, and high blood pressure? Blood pressure and heart disease  High blood pressure causes heart disease and increases the risk of stroke. This is more likely to develop in people who have high blood pressure readings, are of African descent, or are overweight.  Talk with your health care provider about your target blood pressure readings.  Have your blood pressure checked: ? Every 3-5 years if you are 30-73 years of age. ? Every year if you are 72 years old or older.  If you are between the ages of 70 and 88 and are a current or former smoker, ask your health care provider if you should have a one-time screening for abdominal aortic aneurysm (AAA). Diabetes Have regular diabetes screenings. This checks your fasting blood sugar level. Have the screening done:  Once every three years after age 39 if you are at a normal weight and have a low risk for diabetes.  More often and at a younger age if you are overweight or have a high risk for diabetes. What should  I know about preventing infection? Hepatitis B If you have a higher risk for hepatitis B, you should be screened for this virus. Talk with your health care provider to find out if you are at risk for hepatitis B infection. Hepatitis C Blood testing is recommended for:  Everyone born from [birth year] through 1966.  Anyone with known risk factors for hepatitis C. Sexually transmitted infections (STIs)  You should be screened each year for STIs, including gonorrhea and chlamydia, if: ? You are sexually active and are younger  than 69 years of age. ? You are older than 69 years of age and your health care provider tells you that you are at risk for this type of infection. ? Your sexual activity has changed since you were last screened, and you are at increased risk for chlamydia or gonorrhea. Ask your health care provider if you are at risk.  Ask your health care provider about whether you are at high risk for HIV. Your health care provider may recommend a prescription medicine to help prevent HIV infection. If you choose to take medicine to prevent HIV, you should first get tested for HIV. You should then be tested every 3 months for as long as you are taking the medicine. Follow these instructions at home: Lifestyle  Do not use any products that contain nicotine or tobacco, such as cigarettes, e-cigarettes, and chewing tobacco. If you need help quitting, ask your health care provider.  Do not use street drugs.  Do not share needles.  Ask your health care provider for help if you need support or information about quitting drugs. Alcohol use  Do not drink alcohol if your health care provider tells you not to drink.  If you drink alcohol: ? Limit how much you have to 0-2 drinks a day. ? Be aware of how much alcohol is in your drink. In the [place] one drink equals one 12 oz bottle of beer (355 mL), one 5 oz glass of wine (148 mL), or one 1 oz glass of hard liquor (44 mL). General instructions  Schedule regular health, dental, and eye exams.  Stay current with your vaccines.  Tell your health care provider if: ? You often feel depressed. ? You have ever been abused or do not feel safe at home. Summary  Adopting a healthy lifestyle and getting preventive care are important in promoting health and wellness.  Follow your health care provider's instructions about healthy diet, exercising, and getting tested or screened for diseases.  Follow your health care provider's instructions on monitoring your  cholesterol and blood pressure. This information is not intended to replace advice given to you by your health care provider. Make sure you discuss any questions you have with your health care provider. Document Released: 07/06/2007 Document Revised: 12/31/2017 Document Reviewed: 12/31/2017 Elsevier Patient Education  2020 [employer].

## 2018-08-27 ENCOUNTER — Encounter: Payer: BLUE CROSS/BLUE SHIELD | Admitting: Internal Medicine

## 2018-08-28 ENCOUNTER — Other Ambulatory Visit: Payer: Self-pay

## 2018-08-28 DIAGNOSIS — R7303 Prediabetes: Secondary | ICD-10-CM

## 2018-08-28 DIAGNOSIS — Z79899 Other long term (current) drug therapy: Secondary | ICD-10-CM

## 2018-08-28 MED ORDER — METFORMIN HCL 500 MG PO TABS: 500.0000 mg | ORAL_TABLET | Freq: Two times a day (BID) | ORAL | 1 refills | Status: DC

## 2018-08-28 NOTE — Addendum Note (Signed)
Addended by: Modena Morrow R on: 08/28/2018 04:48 PM   Modules accepted: Orders

## 2018-09-16 DIAGNOSIS — H2513 Age-related nuclear cataract, bilateral: Secondary | ICD-10-CM | POA: Diagnosis not present

## 2018-09-24 DIAGNOSIS — H2512 Age-related nuclear cataract, left eye: Secondary | ICD-10-CM | POA: Diagnosis not present

## 2018-11-23 ENCOUNTER — Other Ambulatory Visit: Payer: Self-pay | Admitting: Internal Medicine

## 2018-11-24 DIAGNOSIS — L089 Local infection of the skin and subcutaneous tissue, unspecified: Secondary | ICD-10-CM | POA: Diagnosis not present

## 2018-11-24 DIAGNOSIS — L0291 Cutaneous abscess, unspecified: Secondary | ICD-10-CM | POA: Diagnosis not present

## 2018-11-24 DIAGNOSIS — T8140XA Infection following a procedure, unspecified, initial encounter: Secondary | ICD-10-CM | POA: Diagnosis not present

## 2018-11-24 DIAGNOSIS — D229 Melanocytic nevi, unspecified: Secondary | ICD-10-CM | POA: Diagnosis not present

## 2018-11-24 DIAGNOSIS — L821 Other seborrheic keratosis: Secondary | ICD-10-CM | POA: Diagnosis not present

## 2018-11-24 DIAGNOSIS — L080 Pyoderma: Secondary | ICD-10-CM | POA: Diagnosis not present

## 2018-12-16 DIAGNOSIS — L304 Erythema intertrigo: Secondary | ICD-10-CM | POA: Diagnosis not present

## 2018-12-16 DIAGNOSIS — L723 Sebaceous cyst: Secondary | ICD-10-CM | POA: Diagnosis not present

## 2019-02-04 ENCOUNTER — Other Ambulatory Visit: Payer: Self-pay | Admitting: Physician Assistant

## 2019-02-04 DIAGNOSIS — L720 Epidermal cyst: Secondary | ICD-10-CM | POA: Diagnosis not present

## 2019-02-18 ENCOUNTER — Other Ambulatory Visit: Payer: Self-pay | Admitting: Physician Assistant

## 2019-02-18 DIAGNOSIS — C4441 Basal cell carcinoma of skin of scalp and neck: Secondary | ICD-10-CM | POA: Diagnosis not present

## 2019-02-18 DIAGNOSIS — C4491 Basal cell carcinoma of skin, unspecified: Secondary | ICD-10-CM

## 2019-02-18 DIAGNOSIS — L570 Actinic keratosis: Secondary | ICD-10-CM | POA: Diagnosis not present

## 2019-02-18 HISTORY — DX: Basal cell carcinoma of skin, unspecified: C44.91

## 2019-02-22 ENCOUNTER — Other Ambulatory Visit: Payer: Self-pay | Admitting: Internal Medicine

## 2019-03-04 DIAGNOSIS — H40013 Open angle with borderline findings, low risk, bilateral: Secondary | ICD-10-CM | POA: Diagnosis not present

## 2019-03-11 ENCOUNTER — Other Ambulatory Visit: Payer: Self-pay | Admitting: Dermatology

## 2019-03-11 DIAGNOSIS — C4441 Basal cell carcinoma of skin of scalp and neck: Secondary | ICD-10-CM | POA: Diagnosis not present

## 2019-03-25 ENCOUNTER — Other Ambulatory Visit: Payer: Self-pay

## 2019-03-26 ENCOUNTER — Encounter: Payer: Self-pay | Admitting: Internal Medicine

## 2019-03-26 ENCOUNTER — Ambulatory Visit (INDEPENDENT_AMBULATORY_CARE_PROVIDER_SITE_OTHER): Payer: BC Managed Care – PPO | Admitting: Internal Medicine

## 2019-03-26 VITALS — BP 138/82 | HR 93 | Temp 97.9°F | Ht 65.0 in | Wt 238.8 lb

## 2019-03-26 DIAGNOSIS — Z7189 Other specified counseling: Secondary | ICD-10-CM | POA: Diagnosis not present

## 2019-03-26 DIAGNOSIS — Z0000 Encounter for general adult medical examination without abnormal findings: Secondary | ICD-10-CM | POA: Diagnosis not present

## 2019-03-26 DIAGNOSIS — R7303 Prediabetes: Secondary | ICD-10-CM | POA: Diagnosis not present

## 2019-03-26 DIAGNOSIS — Z8546 Personal history of malignant neoplasm of prostate: Secondary | ICD-10-CM

## 2019-03-26 DIAGNOSIS — Z79899 Other long term (current) drug therapy: Secondary | ICD-10-CM

## 2019-03-26 DIAGNOSIS — I10 Essential (primary) hypertension: Secondary | ICD-10-CM

## 2019-03-26 DIAGNOSIS — E669 Obesity, unspecified: Secondary | ICD-10-CM

## 2019-03-26 DIAGNOSIS — Z7185 Encounter for immunization safety counseling: Secondary | ICD-10-CM

## 2019-03-26 LAB — BASIC METABOLIC PANEL
BUN: 15 mg/dL (ref 6–23)
CO2: 30 mEq/L (ref 19–32)
Calcium: 9.3 mg/dL (ref 8.4–10.5)
Chloride: 100 mEq/L (ref 96–112)
Creatinine, Ser: 1.09 mg/dL (ref 0.40–1.50)
GFR: 67.01 mL/min (ref >60.00)
Glucose, Bld: 111 mg/dL — ABNORMAL HIGH (ref 70–99)
Potassium: 3.7 mEq/L (ref 3.5–5.1)
Sodium: 139 mEq/L (ref 135–145)

## 2019-03-26 LAB — TSH: TSH: 3.63 u[IU]/mL (ref 0.35–4.50)

## 2019-03-26 LAB — LIPID PANEL
Cholesterol: 151 mg/dL (ref 0–200)
HDL: 38.7 mg/dL — ABNORMAL LOW (ref >39.00)
LDL Cholesterol: 82 mg/dL (ref 0–99)
NonHDL: 112.59
Total CHOL/HDL Ratio: 4
Triglycerides: 152 mg/dL — ABNORMAL HIGH (ref 0.0–149.0)
VLDL: 30.4 mg/dL (ref 0.0–40.0)

## 2019-03-26 LAB — T4, FREE: Free T4: 0.63 ng/dL (ref 0.60–1.60)

## 2019-03-26 LAB — HEMOGLOBIN A1C: Hgb A1c MFr Bld: 7.2 % — ABNORMAL HIGH (ref 4.6–6.5)

## 2019-03-26 NOTE — Progress Notes (Signed)
Chief Complaint  Patient presents with  . Annual Exam    Pt has no concerns today     HPI: Patient  Daniel YANIK Sr.  69 y.o. comes in today for [place] visit  Metformin increased after last labs to bid 1000 mg per day  Still does sugar drinks no new sx of such  BP doing ok  On meds Had cataract ssuregery left  In past  Doing ok  Still working ft office  Safely distanced   Space  HLD takign med no se  Health Maintenance  Topic Date Due  . Hepatitis C Screening  02-26-1950  . COLONOSCOPY  04/12/2017  . OPHTHALMOLOGY EXAM  02/24/2019  . HEMOGLOBIN A1C  02/25/2019  . INFLUENZA VACCINE  04/21/2019 (Originally 08/22/2018)  . PNA vac Low Risk Adult (2 of 2 - PPSV23) 08/25/2019 (Originally 06/03/2017)  . FOOT EXAM  03/25/2020  . TETANUS/TDAP  05/13/2022   Health Maintenance Review LIFESTYLE:  Exercise:   On and off at gym Tobacco/ETS:n Alcohol: n Sugar beverages:one  Day  Pepsi  Coke  Sleep:9   Drug use: no HH of 2 Work: Ft     ROS:  GEN/ HEENT: No fever, significant weight changes sweats headaches vision problems hearing changes, CV/ PULM; No chest pain shortness of breath cough, syncope,edema  change in exercise tolerance. GI /GU: No adominal pain, vomiting, change in bowel habits. No blood in the stool. No significant GU symptoms. SKIN/HEME: ,no acute skin rashes suspicious lesions or bleeding. No lymphadenopathy, nodules, masses.  NEURO/ PSYCH:  No neurologic signs such as weakness numbness. No depression anxiety. IMM/ Allergy: No unusual infections.  Allergy .   REST of 12 system review negative except as per HPI   Past Medical History:  Diagnosis Date  . Allergy   . Complication of anesthesia   . History of kidney stones   . History of renal stone   . Hx of bacterial pneumonia    hosp  2001  . Hx of diverticulitis of colon    hosp 2001  . Hyperlipidemia   . Hypertension   . Prostate CA ([place]) 10-24-11   7'13- bx. of prostate + cancer-surgery  planned  . Prostate cancer ([place]) 10/31/2011    Past Surgical History:  Procedure Laterality Date  . EXTRACORPOREAL SHOCK WAVE LITHOTRIPSY Left 05/30/2016   Procedure: LEFT EXTRACORPOREAL SHOCK WAVE LITHOTRIPSY (ESWL);  Surgeon: Cleon Gustin, MD;  Location: [facility];  Service: Urology;  Laterality: Left;  . ROBOT ASSISTED LAPAROSCOPIC RADICAL PROSTATECTOMY  10/28/2011   Procedure: ROBOTIC ASSISTED LAPAROSCOPIC RADICAL PROSTATECTOMY;  Surgeon: Malka So, MD;  Location: [facility];  Service: Urology;  Laterality: N/A;  . SHOULDER SURGERY     lt. arthroscopy  . WISDOM TOOTH EXTRACTION  10-24-11   x2 extracted    Family History  Problem Relation Age of Onset  . Diabetes Mother   . Colon cancer Father   . Cancer Father        colon  . Diabetes Sister        borderline  . Diabetes Brother        borderline  . ADD / ADHD Son     Social History   Socioeconomic History  . Marital status: Married    Spouse name: Not on file  . Number of children: Not on file  . Years of education: Not on file  . Highest education level: Not on file  Occupational History  . Not  on file  Tobacco Use  . Smoking status: Never Smoker  . Smokeless tobacco: Never Used  Substance and Sexual Activity  . Alcohol use: Yes    Comment: very use 1-2 per yr  . Drug use: No  . Sexual activity: Not Currently  Other Topics Concern  . 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    Accountant   2 cats and a dog   Social Determinants of Health   Financial Resource Strain:   . Difficulty of Paying Living Expenses: Not on file  Food Insecurity:   . Worried About Charity fundraiser in the Last Year: Not on file  . Ran Out of Food in the Last Year: Not on file  Transportation Needs:   . Lack of Transportation (Medical): Not on file  . Lack of Transportation (Non-Medical): Not on file  Physical Activity:   . Days of Exercise per Week: Not on file  . Minutes of Exercise per  Session: Not on file  Stress:   . Feeling of Stress : Not on file  Social Connections:   . Frequency of Communication with Friends and Family: Not on file  . Frequency of Social Gatherings with Friends and Family: Not on file  . Attends Religious Services: Not on file  . Active Member of Clubs or Organizations: Not on file  . Attends Archivist Meetings: Not on file  . Marital Status: Not on file    Outpatient Medications Prior to Visit  Medication Sig Dispense Refill  . atorvastatin (LIPITOR) 20 MG tablet TAKE 1 TABLET BY MOUTH EVERY DAY 90 tablet 1  . fluticasone (FLONASE) 50 MCG/ACT nasal spray PLACE 2 SPRAYS IN EACH NOSTRIL EVERY DAY 48 g 12  . hydrochlorothiazide (HYDRODIURIL) 25 MG tablet TAKE 1 TABLET BY MOUTH EVERY DAY 90 tablet 1  . metFORMIN (GLUCOPHAGE) 500 MG tablet TAKE 1 TABLET BY MOUTH 2 TIMES DAILY WITH A MEAL 180 tablet 1  . telmisartan (MICARDIS) 80 MG tablet TAKE 1 TABLET BY MOUTH EVERY DAY 90 tablet 1   No facility-administered medications prior to visit.     EXAM:  BP 138/82 (BP Location: Right Arm, Patient Position: Sitting, Cuff Size: Normal)   Pulse 93   Temp 97.9 F (36.6 C) (Temporal)   Ht 5\' 5"  (1.651 m)   Wt 238 lb 12.8 oz (108.3 kg)   SpO2 97%   BMI 39.74 kg/m   Body mass index is 39.74 kg/m. Wt Readings from Last 3 Encounters:  03/26/19 238 lb 12.8 oz (108.3 kg)  08/25/18 239 lb 3.2 oz (108.5 kg)  12/01/17 235 lb 12.8 oz (107 kg)    Physical Exam: Vital signs reviewed [id number] is a well-developed well-nourished alert cooperative    who appearsr stated age in no acute distress.  HEENT: normocephalic atraumatic , Eyes: PERRL EOM's full, conjunctiva clear,  Ears: no deformity EAC's clear TMs with normal landmarks. Mouth: clear OP, masked NECK: supple without masses, thyromegaly or bruits. CHEST/PULM:  Clear to auscultation and percussion breath sounds equal no wheeze , rales or rhonchi. No chest wall deformities or tenderness.   . CV: PMI is nondisplaced, S1 S2 no gallops, murmurs, rubs. Peripheral pulses are full without delay.No JVD .  ABDOMEN: Bowel sounds normal nontender  No guard or rebound, no hepato splenomegal no CVA tenderness.   Extremtities:  No clubbing cyanosis or edema, no acute joint swelling or redness no  focal atrophy NEURO:  Oriented x3, cranial nerves 3-12 appear to be intact, no obvious focal weakness,gait within normal limits no abnormal reflexes or asymmetrical SKIN: No acute rashes normal turgor, color, no bruising or petechiae. Large nontender lipoma righ tupper back  Some sks  PSYCH: Oriented, good eye contact, no obvious depression anxiety, cognition and judgment appear normal. LN: no cervical axillary  adenopathy Diabetic Foot Exam - Simple   Simple Foot Form Diabetic Foot exam was performed with the following findings: Yes 03/26/2019  9:21 AM  Visual Inspection No deformities, no ulcerations, no other skin breakdown bilaterally: Yes Sensation Testing Intact to touch and monofilament testing bilaterally: Yes Pulse Check Posterior Tibialis and Dorsalis pulse intact bilaterally: Yes Comments      Lab Results  Component Value Date   WBC 7.2 08/25/2018   HGB 14.3 08/25/2018   HCT 42.9 08/25/2018   PLT 286.0 08/25/2018   GLUCOSE 113 (H) 08/25/2018   CHOL 156 08/25/2018   TRIG 156.0 (H) 08/25/2018   HDL 38.30 (L) 08/25/2018   LDLCALC 86 08/25/2018   ALT 21 08/25/2018   AST 16 08/25/2018   NA 138 08/25/2018   K 3.7 08/25/2018   CL 99 08/25/2018   CREATININE 1.01 08/25/2018   BUN 16 08/25/2018   CO2 29 08/25/2018   TSH 4.75 (H) 08/25/2018   PSA 0.01 (L) 08/25/2018   HGBA1C 7.1 (H) 08/25/2018   MICROALBUR 2.6 (H) 08/25/2018    BP Readings from Last 3 Encounters:  03/26/19 138/82  08/25/18 138/80  12/01/17 120/80   Fasting  Lab plan  reviewed with patient   ASSESSMENT AND PLAN:  Discussed the following assessment and plan:    ICD-10-CM   1. Visit for preventive  health examination  Z00.00 TSH    T4, free    Hemoglobin A1c    Lipid panel    Basic metabolic panel  2. Medication management  Z79.899 TSH    T4, free    Hemoglobin A1c    Lipid panel    Basic metabolic panel  3. Essential hypertension  I10 TSH    T4, free    Hemoglobin A1c    Lipid panel    Basic metabolic panel  4. Pre-diabetes  R73.03 TSH    T4, free    Hemoglobin A1c    Lipid panel    Basic metabolic panel   [id number] in diabetic range  fbs in prediabetic range  5. Immunization counseling  Z71.89    not planning on covid vaccine at this time  counseled benefit risk   6. Obesity (BMI 30-39.9)  E66.9   7. Personal history of prostate cancer  Z85.46    no sx  no recent uro fu  last psa august 20    Patient Care Team: Burnis Medin, MD as PCP - General McKenzie, Candee Furbish, MD as Consulting Physician (Urology) Patient Instructions  Will notify you  of labs when available.   Continue lifestyle intervention healthy eating and exercise . Healthy weight loss  Goal  Advise covid vaccine  As discussed     Health Maintenance, Male Adopting a healthy lifestyle and getting preventive care are important in promoting health and wellness. Ask your health care provider about:  The right schedule for you to have regular tests and exams.  Things you can do on your own to prevent diseases and keep yourself healthy. What should I know about diet, weight, and exercise? Eat a healthy diet   Eat a diet  that includes plenty of vegetables, fruits, low-fat dairy products, and lean protein.  Do not eat a lot of foods that are high in solid fats, added sugars, or sodium. Maintain a healthy weight Body mass index (BMI) is a measurement that can be used to identify possible weight problems. It estimates body fat based on height and weight. Your health care provider can help determine your BMI and help you achieve or maintain a healthy weight. Get regular exercise Get regular exercise. This is  one of the most important things you can do for your health. Most adults should:  Exercise for at least 150 minutes each week. The exercise should increase your heart rate and make you sweat (moderate-intensity exercise).  Do strengthening exercises at least twice a week. This is in addition to the moderate-intensity exercise.  Spend less time sitting. Even light physical activity can be beneficial. Watch cholesterol and blood lipids Have your blood tested for lipids and cholesterol at 70 years of age, then have this test every 5 years. You may need to have your cholesterol levels checked more often if:  Your lipid or cholesterol levels are high.  You are older than 70 years of age.  You are at high risk for heart disease. What should I know about cancer screening? Many types of cancers can be detected early and may often be prevented. Depending on your health history and family history, you may need to have cancer screening at various ages. This may include screening for:  Colorectal cancer.  Prostate cancer.  Skin cancer.  Lung cancer. What should I know about heart disease, diabetes, and high blood pressure? Blood pressure and heart disease  High blood pressure causes heart disease and increases the risk of stroke. This is more likely to develop in people who have high blood pressure readings, are of African descent, or are overweight.  Talk with your health care provider about your target blood pressure readings.  Have your blood pressure checked: ? Every 3-5 years if you are 50-63 years of age. ? Every year if you are 59 years old or older.  If you are between the ages of 94 and 30 and are a current or former smoker, ask your health care provider if you should have a one-time screening for abdominal aortic aneurysm (AAA). Diabetes Have regular diabetes screenings. This checks your fasting blood sugar level. Have the screening done:  Once every three years after age 50 if  you are at a normal weight and have a low risk for diabetes.  More often and at a younger age if you are overweight or have a high risk for diabetes. What should I know about preventing infection? Hepatitis B If you have a higher risk for hepatitis B, you should be screened for this virus. Talk with your health care provider to find out if you are at risk for hepatitis B infection. Hepatitis C Blood testing is recommended for:  Everyone born from [birth year] through 1965.  Anyone with known risk factors for hepatitis C. Sexually transmitted infections (STIs)  You should be screened each year for STIs, including gonorrhea and chlamydia, if: ? You are sexually active and are younger than 70 years of age. ? You are older than 70 years of age and your health care provider tells you that you are at risk for this type of infection. ? Your sexual activity has changed since you were last screened, and you are at increased risk for chlamydia or gonorrhea. Ask  your health care provider if you are at risk.  Ask your health care provider about whether you are at high risk for HIV. Your health care provider may recommend a prescription medicine to help prevent HIV infection. If you choose to take medicine to prevent HIV, you should first get tested for HIV. You should then be tested every 3 months for as long as you are taking the medicine. Follow these instructions at home: Lifestyle  Do not use any products that contain nicotine or tobacco, such as cigarettes, e-cigarettes, and chewing tobacco. If you need help quitting, ask your health care provider.  Do not use street drugs.  Do not share needles.  Ask your health care provider for help if you need support or information about quitting drugs. Alcohol use  Do not drink alcohol if your health care provider tells you not to drink.  If you drink alcohol: ? Limit how much you have to 0-2 drinks a day. ? Be aware of how much alcohol is in your drink.  In the [place] one drink equals one 12 oz bottle of beer (355 mL), one 5 oz glass of wine (148 mL), or one 1 oz glass of hard liquor (44 mL). General instructions  Schedule regular health, dental, and eye exams.  Stay current with your vaccines.  Tell your health care provider if: ? You often feel depressed. ? You have ever been abused or do not feel safe at home. Summary  Adopting a healthy lifestyle and getting preventive care are important in promoting health and wellness.  Follow your health care provider's instructions about healthy diet, exercising, and getting tested or screened for diseases.  Follow your health care provider's instructions on monitoring your cholesterol and blood pressure. This information is not intended to replace advice given to you by your health care provider. Make sure you discuss any questions you have with your health care provider. Document Revised: 12/31/2017 Document Reviewed: 12/31/2017 Elsevier Patient Education  2020 [employer] Everlee Quakenbush [name]

## 2019-03-26 NOTE — Patient Instructions (Addendum)
Will notify you  of labs when available.   Continue lifestyle intervention healthy eating and exercise . Healthy weight loss  Goal  Advise covid vaccine  As discussed     Health Maintenance, Male Adopting a healthy lifestyle and getting preventive care are important in promoting health and wellness. Ask your health care provider about:  The right schedule for you to have regular tests and exams.  Things you can do on your own to prevent diseases and keep yourself healthy. What should I know about diet, weight, and exercise? Eat a healthy diet   Eat a diet that includes plenty of vegetables, fruits, low-fat dairy products, and lean protein.  Do not eat a lot of foods that are high in solid fats, added sugars, or sodium. Maintain a healthy weight Body mass index (BMI) is a measurement that can be used to identify possible weight problems. It estimates body fat based on height and weight. Your health care provider can help determine your BMI and help you achieve or maintain a healthy weight. Get regular exercise Get regular exercise. This is one of the most important things you can do for your health. Most adults should:  Exercise for at least 150 minutes each week. The exercise should increase your heart rate and make you sweat (moderate-intensity exercise).  Do strengthening exercises at least twice a week. This is in addition to the moderate-intensity exercise.  Spend less time sitting. Even light physical activity can be beneficial. Watch cholesterol and blood lipids Have your blood tested for lipids and cholesterol at 70 years of age, then have this test every 5 years. You may need to have your cholesterol levels checked more often if:  Your lipid or cholesterol levels are high.  You are older than 70 years of age.  You are at high risk for heart disease. What should I know about cancer screening? Many types of cancers can be detected early and may often be prevented.  Depending on your health history and family history, you may need to have cancer screening at various ages. This may include screening for:  Colorectal cancer.  Prostate cancer.  Skin cancer.  Lung cancer. What should I know about heart disease, diabetes, and high blood pressure? Blood pressure and heart disease  High blood pressure causes heart disease and increases the risk of stroke. This is more likely to develop in people who have high blood pressure readings, are of African descent, or are overweight.  Talk with your health care provider about your target blood pressure readings.  Have your blood pressure checked: ? Every 3-5 years if you are 44-6 years of age. ? Every year if you are 58 years old or older.  If you are between the ages of 8 and 38 and are a current or former smoker, ask your health care provider if you should have a one-time screening for abdominal aortic aneurysm (AAA). Diabetes Have regular diabetes screenings. This checks your fasting blood sugar level. Have the screening done:  Once every three years after age 76 if you are at a normal weight and have a low risk for diabetes.  More often and at a younger age if you are overweight or have a high risk for diabetes. What should I know about preventing infection? Hepatitis B If you have a higher risk for hepatitis B, you should be screened for this virus. Talk with your health care provider to find out if you are at risk for hepatitis B infection.  Hepatitis C Blood testing is recommended for:  Everyone born from [birth year] through 1965.  Anyone with known risk factors for hepatitis C. Sexually transmitted infections (STIs)  You should be screened each year for STIs, including gonorrhea and chlamydia, if: ? You are sexually active and are younger than 70 years of age. ? You are older than 70 years of age and your health care provider tells you that you are at risk for this type of infection. ? Your sexual  activity has changed since you were last screened, and you are at increased risk for chlamydia or gonorrhea. Ask your health care provider if you are at risk.  Ask your health care provider about whether you are at high risk for HIV. Your health care provider may recommend a prescription medicine to help prevent HIV infection. If you choose to take medicine to prevent HIV, you should first get tested for HIV. You should then be tested every 3 months for as long as you are taking the medicine. Follow these instructions at home: Lifestyle  Do not use any products that contain nicotine or tobacco, such as cigarettes, e-cigarettes, and chewing tobacco. If you need help quitting, ask your health care provider.  Do not use street drugs.  Do not share needles.  Ask your health care provider for help if you need support or information about quitting drugs. Alcohol use  Do not drink alcohol if your health care provider tells you not to drink.  If you drink alcohol: ? Limit how much you have to 0-2 drinks a day. ? Be aware of how much alcohol is in your drink. In the [place] one drink equals one 12 oz bottle of beer (355 mL), one 5 oz glass of wine (148 mL), or one 1 oz glass of hard liquor (44 mL). General instructions  Schedule regular health, dental, and eye exams.  Stay current with your vaccines.  Tell your health care provider if: ? You often feel depressed. ? You have ever been abused or do not feel safe at home. Summary  Adopting a healthy lifestyle and getting preventive care are important in promoting health and wellness.  Follow your health care provider's instructions about healthy diet, exercising, and getting tested or screened for diseases.  Follow your health care provider's instructions on monitoring your cholesterol and blood pressure. This information is not intended to replace advice given to you by your health care provider. Make sure you discuss any questions you have  with your health care provider. Document Revised: 12/31/2017 Document Reviewed: 12/31/2017 Elsevier Patient Education  2020 [employer].

## 2019-03-27 NOTE — Progress Notes (Signed)
Blood sugars about the same  thyroid is normal this time   Try increase the metformin to 500 mg take 3 per day ( can take 2 in am one in pm)  Stay on cholesterol med  Plan fu in 4-6 months  as planned to recheck sugar control Intensify lifestyle interventions. To get the weight down  which will help the blood sugar

## 2019-06-01 ENCOUNTER — Encounter: Payer: Self-pay | Admitting: Physician Assistant

## 2019-06-01 ENCOUNTER — Ambulatory Visit: Payer: BC Managed Care – PPO | Admitting: Physician Assistant

## 2019-06-01 ENCOUNTER — Other Ambulatory Visit: Payer: Self-pay

## 2019-06-01 DIAGNOSIS — Z1283 Encounter for screening for malignant neoplasm of skin: Secondary | ICD-10-CM

## 2019-06-01 DIAGNOSIS — Z86018 Personal history of other benign neoplasm: Secondary | ICD-10-CM

## 2019-06-01 DIAGNOSIS — B079 Viral wart, unspecified: Secondary | ICD-10-CM

## 2019-06-01 DIAGNOSIS — Z85828 Personal history of other malignant neoplasm of skin: Secondary | ICD-10-CM

## 2019-06-01 DIAGNOSIS — L820 Inflamed seborrheic keratosis: Secondary | ICD-10-CM | POA: Diagnosis not present

## 2019-06-01 DIAGNOSIS — L570 Actinic keratosis: Secondary | ICD-10-CM | POA: Diagnosis not present

## 2019-06-01 NOTE — Patient Instructions (Addendum)
Discussed with the patient doing a field therapy on his forehead/scalp during the Fall/Winter. Told him that he would use the Fluorouracil 5% nightly for 2 weeks or until a brisk irritation. Avoid sun exposure.

## 2019-06-01 NOTE — Progress Notes (Signed)
   Follow-Up Visit   Subjective  Daniel Randall. is a 69 y.o. male who presents for the following: Follow-up (Here this morning to recheck right postauricular. Dr. Denna Haggard removed [place], nodular on 03/11/2019; no residual BCC, margins free. Healed well. Does have two crusty places on his left forearm. No soreness or bleeding. Place on his right upper chest that has been there forever but now looks like it has two colors.).   The following portions of the chart were reviewed this encounter and updated as appropriate: Tobacco  Allergies  Meds  Problems  Med Hx  Surg Hx  Fam Hx      Objective  Well appearing patient in no apparent distress; mood and affect are within normal limits.  All skin waist up examined.  Objective  Mid Parietal Scalp (3): Erythematous patches with gritty scale.  Objective  Right Postauricular Area: Pink linear stitch line. Clear without sign of BCC  Objective  Left Lower Back: Scars  clear  Objective  Left Forearm - Anterior: Erythematous stuck-on, waxy papule or plaque.   Objective  Right Breast, Right Upper Arm - Anterior: Verrucous papules -- Discussed viral etiology and contagion.   Assessment & Plan  AK (actinic keratosis) (3) Mid Parietal Scalp  Destruction of lesion - Mid Parietal Scalp Complexity: simple   Destruction method: cryotherapy   Informed consent: discussed and consent obtained   Timeout:  patient name, date of birth, surgical site, and procedure verified Lesion destroyed using liquid nitrogen: Yes   Cryotherapy cycles:  1 Outcome: patient tolerated procedure well with no complications   Post-procedure details: wound care instructions given    History of basal cell carcinoma (BCC) Right Postauricular Area  Yearly skin exam  History of dysplastic nevus Left Lower Back  Yearly skin exams  Inflamed seborrheic keratosis Left Forearm - Anterior  Destruction of lesion - Left Forearm - Anterior Complexity: simple     Destruction method: cryotherapy   Informed consent: discussed and consent obtained   Timeout:  patient name, date of birth, surgical site, and procedure verified Lesion destroyed using liquid nitrogen: Yes   Cryotherapy cycles:  1 Outcome: patient tolerated procedure well with no complications   Post-procedure details: wound care instructions given    Screening exam for skin cancer Head - Anterior (Face)  Viral warts, unspecified type (2) Right Upper Arm - Anterior; Right Breast  Permission obtained to remove. No anesthesia was given. Was removed with scissors. Hemostasis was attained with aluminum chloride. Vaseline was applied with bandaid. Pt . Tolerated procedure well.

## 2019-07-12 ENCOUNTER — Other Ambulatory Visit: Payer: Self-pay

## 2019-07-12 MED ORDER — METFORMIN HCL 500 MG PO TABS: 500.0000 mg | ORAL_TABLET | Freq: Three times a day (TID) | ORAL | 0 refills | Status: DC

## 2019-08-20 ENCOUNTER — Other Ambulatory Visit: Payer: Self-pay | Admitting: Internal Medicine

## 2019-09-09 DIAGNOSIS — H40013 Open angle with borderline findings, low risk, bilateral: Secondary | ICD-10-CM | POA: Diagnosis not present

## 2019-09-29 NOTE — Progress Notes (Signed)
Chief Complaint  Patient presents with  . Follow-up    Doing well  . Medication Management    HPI: Daniel NIEHOFF Sr. 69 y.o. come in for Chronic disease management   Last cpx march  Walking  And gym work .  a1c was 7.2   Increase metformin to 1500 per day  Had covid vaccine  [employer]   Last early august .     Working on site.  FT .   Bp has been ok  HLD  On meds  ROS: See pertinent positives and negatives per HPI.  Past Medical History:  Diagnosis Date  . Allergy   . Atypical mole 12/30/2011   Right Side of Back(severe)  . Atypical mole 03/10/2018   Left Side Outer(mild) (free)  . BCC (basal cell carcinoma of skin) 02/18/2019   Behind Right Ear(residual) (excision)  . Complication of anesthesia   . History of kidney stones   . History of renal stone   . Hx of bacterial pneumonia    hosp  2001  . Hx of diverticulitis of colon    hosp 2001  . Hyperlipidemia   . Hypertension   . Nodular basal cell carcinoma (BCC) 01/04/2013   Behind Right Ear  . Prostate CA ([place]) 10-24-11   7'13- bx. of prostate + cancer-surgery planned  . Prostate cancer ([place]) 10/31/2011    Family History  Problem Relation Age of Onset  . Diabetes Mother   . Colon cancer Father   . Cancer Father        colon  . Diabetes Sister        borderline  . Diabetes Brother        borderline  . ADD / ADHD Son     Social History   Socioeconomic History  . Marital status: Married    Spouse name: Not on file  . Number of children: Not on file  . Years of education: Not on file  . Highest education level: Not on file  Occupational History  . Not on file  Tobacco Use  . Smoking status: Never Smoker  . Smokeless tobacco: Never Used  Vaping Use  . Vaping Use: Never used  Substance and Sexual Activity  . Alcohol use: Yes    Comment: very use 1-2 per yr  . Drug use: Never  . Sexual activity: Not Currently  Other Topics Concern  . 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    Accountant   2 cats and a dog   Social Determinants of Health   Financial Resource Strain:   . Difficulty of Paying Living Expenses: Not on file  Food Insecurity:   . Worried About Charity fundraiser in the Last Year: Not on file  . Ran Out of Food in the Last Year: Not on file  Transportation Needs:   . Lack of Transportation (Medical): Not on file  . Lack of Transportation (Non-Medical): Not on file  Physical Activity:   . Days of Exercise per Week: Not on file  . Minutes of Exercise per Session: Not on file  Stress:   . Feeling of Stress : Not on file  Social Connections:   . Frequency of Communication with Friends and Family: Not on file  . Frequency of Social Gatherings with Friends and Family: Not on file  . Attends Religious Services: Not on file  . Active Member of Clubs  or Organizations: Not on file  . Attends Archivist Meetings: Not on file  . Marital Status: Not on file    Outpatient Medications Prior to Visit  Medication Sig Dispense Refill  . atorvastatin (LIPITOR) 20 MG tablet TAKE 1 TABLET BY MOUTH EVERY DAY 90 tablet 1  . fluticasone (FLONASE) 50 MCG/ACT nasal spray PLACE 2 SPRAYS IN EACH NOSTRIL EVERY DAY 48 g 12  . hydrochlorothiazide (HYDRODIURIL) 25 MG tablet TAKE 1 TABLET BY MOUTH EVERY DAY 90 tablet 1  . metFORMIN (GLUCOPHAGE) 500 MG tablet Take 1 tablet (500 mg total) by mouth 3 (three) times daily with meals. Take 2 tablets in am and 1 tablet in pm. 270 tablet 0  . telmisartan (MICARDIS) 80 MG tablet TAKE 1 TABLET BY MOUTH EVERY DAY 90 tablet 1   No facility-administered medications prior to visit.     EXAM:  BP 130/74   Pulse (!) 102   Temp 99 F (37.2 C) (Oral)   Ht 5\' 5"  (1.651 m)   Wt 237 lb 9.6 oz (107.8 kg)   SpO2 97%   BMI 39.54 kg/m   Body mass index is 39.54 kg/m. Wt Readings from Last 3 Encounters:  10/01/19 237 lb 9.6 oz (107.8 kg)  03/26/19 238 lb 12.8 oz (108.3 kg)  08/25/18 239 lb 3.2 oz  (108.5 kg)    GENERAL: vitals reviewed and listed above, alert, oriented, appears well hydrated and in no acute distress HEENT: atraumatic, conjunctiva  clear, no obvious abnormalities on inspection of external nose and ears OP : masked  NECK: no obvious masses on inspection palpation  LUNGS: clear to auscultation bilaterally, no wheezes, rales or rhonchi, good air movement CV: HRRR, no clubbing cyanosis or  peripheral edema nl cap refill  MS: moves all extremities without noticeable focal  abnormality PSYCH: pleasant and cooperative, no obvious depression or anxiety Lab Results  Component Value Date   WBC 7.2 08/25/2018   HGB 14.3 08/25/2018   HCT 42.9 08/25/2018   PLT 286.0 08/25/2018   GLUCOSE 111 (H) 03/26/2019   CHOL 151 03/26/2019   TRIG 152.0 (H) 03/26/2019   HDL 38.70 (L) 03/26/2019   LDLCALC 82 03/26/2019   ALT 21 08/25/2018   AST 16 08/25/2018   NA 139 03/26/2019   K 3.7 03/26/2019   CL 100 03/26/2019   CREATININE 1.09 03/26/2019   BUN 15 03/26/2019   CO2 30 03/26/2019   TSH 3.63 03/26/2019   PSA 0.01 (L) 08/25/2018   HGBA1C 6.9 (A) 10/01/2019   MICROALBUR 2.6 (H) 08/25/2018   BP Readings from Last 3 Encounters:  10/01/19 130/74  03/26/19 138/82  08/25/18 138/80  .[place]  ASSESSMENT AND PLAN:  Discussed the following assessment and plan:  Type 2 diabetes mellitus with hyperglycemia, without long-term current use of insulin ([place]) - Plan: POCT glycosylated hemoglobin (Hb A1C), Hemoglobin A1c, Lipid panel, Comprehensive metabolic panel, CBC with Differential/Platelet  Medication management - Plan: Hemoglobin A1c, Lipid panel, Comprehensive metabolic panel, CBC with Differential/Platelet  Fasting hyperglycemia - Plan: Hemoglobin A1c, Lipid panel, Comprehensive metabolic panel, CBC with Differential/Platelet  Obesity (BMI 30-39.9) - Plan: Hemoglobin A1c, Lipid panel, Comprehensive metabolic panel, CBC with Differential/Platelet  Need for immunization against  influenza - Plan: Flu Vaccine QUAD High Dose(Fluad)  Essential hypertension - Plan: Hemoglobin A1c, Lipid panel, Comprehensive metabolic panel, CBC with Differential/Platelet  Hyperlipidemia, unspecified hyperlipidemia type - Plan: Hemoglobin A1c, Lipid panel, Comprehensive metabolic panel, CBC with Differential/Platelet  Personal history of prostate cancer - Plan:  PSA a1c is better  Diabetic range  Counseled   About weight control.   disc other options  Plan labs and  cpx in about 4 months  -Patient advised to return or notify health care team  if  new concerns arise.  Patient Instructions  Stay on same medication    decrease sugar load .   Plan  ROV   in 4 months .   Lab pre visit   Standley Brooking. Gustin Zobrist [name]

## 2019-10-01 ENCOUNTER — Other Ambulatory Visit: Payer: Self-pay

## 2019-10-01 ENCOUNTER — Encounter: Payer: Self-pay | Admitting: Internal Medicine

## 2019-10-01 ENCOUNTER — Ambulatory Visit: Payer: BC Managed Care – PPO | Admitting: Internal Medicine

## 2019-10-01 VITALS — BP 130/74 | HR 102 | Temp 99.0°F | Ht 65.0 in | Wt 237.6 lb

## 2019-10-01 DIAGNOSIS — Z79899 Other long term (current) drug therapy: Secondary | ICD-10-CM | POA: Diagnosis not present

## 2019-10-01 DIAGNOSIS — E669 Obesity, unspecified: Secondary | ICD-10-CM | POA: Diagnosis not present

## 2019-10-01 DIAGNOSIS — E1165 Type 2 diabetes mellitus with hyperglycemia: Secondary | ICD-10-CM

## 2019-10-01 DIAGNOSIS — I10 Essential (primary) hypertension: Secondary | ICD-10-CM

## 2019-10-01 DIAGNOSIS — E785 Hyperlipidemia, unspecified: Secondary | ICD-10-CM

## 2019-10-01 DIAGNOSIS — R7303 Prediabetes: Secondary | ICD-10-CM | POA: Insufficient documentation

## 2019-10-01 DIAGNOSIS — Z23 Encounter for immunization: Secondary | ICD-10-CM | POA: Diagnosis not present

## 2019-10-01 DIAGNOSIS — Z8546 Personal history of malignant neoplasm of prostate: Secondary | ICD-10-CM

## 2019-10-01 DIAGNOSIS — R7301 Impaired fasting glucose: Secondary | ICD-10-CM | POA: Diagnosis not present

## 2019-10-01 LAB — POCT GLYCOSYLATED HEMOGLOBIN (HGB A1C): Hemoglobin A1C: 6.9 % — AB (ref 4.0–5.6)

## 2019-10-01 NOTE — Patient Instructions (Addendum)
Stay on same medication    decrease sugar load .   Plan  ROV   in 4 months .   Lab pre visit

## 2019-11-22 ENCOUNTER — Other Ambulatory Visit: Payer: Self-pay | Admitting: Internal Medicine

## 2019-12-07 ENCOUNTER — Ambulatory Visit: Payer: BC Managed Care – PPO | Admitting: Dermatology

## 2019-12-07 ENCOUNTER — Other Ambulatory Visit: Payer: Self-pay

## 2019-12-07 ENCOUNTER — Encounter: Payer: Self-pay | Admitting: Dermatology

## 2019-12-07 DIAGNOSIS — E119 Type 2 diabetes mellitus without complications: Secondary | ICD-10-CM | POA: Diagnosis not present

## 2019-12-07 DIAGNOSIS — Z1283 Encounter for screening for malignant neoplasm of skin: Secondary | ICD-10-CM | POA: Diagnosis not present

## 2019-12-07 DIAGNOSIS — L570 Actinic keratosis: Secondary | ICD-10-CM

## 2019-12-07 DIAGNOSIS — D485 Neoplasm of uncertain behavior of skin: Secondary | ICD-10-CM

## 2019-12-07 DIAGNOSIS — L820 Inflamed seborrheic keratosis: Secondary | ICD-10-CM | POA: Diagnosis not present

## 2019-12-07 DIAGNOSIS — Z85828 Personal history of other malignant neoplasm of skin: Secondary | ICD-10-CM

## 2019-12-07 NOTE — Progress Notes (Signed)
   Follow-Up Visit   Subjective  Daniel Randall. is a 69 y.o. male who presents for the following: Follow-up ([place]).   crusts Location:  Duration:  Quality:  Associated Signs/Symptoms: Modifying Factors:  Severity:  Timing: Context:   Objective  Well appearing patient in no apparent distress; mood and affect are within normal limits.  All skin waist up examined.   Assessment & Plan    History of basal cell cancer Right Triangular Fossa  Neoplasm of uncertain behavior of skin Right Lower Back  Skin / nail biopsy Type of biopsy: tangential   Informed consent: discussed and consent obtained   Timeout: patient name, date of birth, surgical site, and procedure verified   Procedure prep:  Patient was prepped and draped in usual sterile fashion (Non sterile) Prep type:  Chlorhexidine Anesthesia: the lesion was anesthetized in a standard fashion   Anesthetic:  1% lidocaine w/ epinephrine 1-100,000 local infiltration Instrument used: flexible razor blade   Outcome: patient tolerated procedure well   Post-procedure details: wound care instructions given    Specimen 1 - Surgical pathology Differential Diagnosis: r/o atypia Check Margins: No  Encounter for screening for malignant neoplasm of skin Left Upper Back  Yearly skin check  AK (actinic keratosis) (2) Mid Parietal Scalp; Right Forehead  Destruction of lesion - Mid Parietal Scalp, Right Forehead Complexity: simple   Destruction method: cryotherapy   Informed consent: discussed and consent obtained   Timeout:  patient name, date of birth, surgical site, and procedure verified Lesion destroyed using liquid nitrogen: Yes   Cryotherapy cycles:  5 Outcome: patient tolerated procedure well with no complications       I, Lavonna Monarch, MD, have reviewed all documentation for this visit.  The documentation on 12/07/19 for the exam, diagnosis, procedures, and orders are all accurate and  complete.

## 2019-12-10 ENCOUNTER — Ambulatory Visit: Payer: BC Managed Care – PPO | Admitting: Physician Assistant

## 2020-02-22 ENCOUNTER — Other Ambulatory Visit: Payer: Self-pay | Admitting: Internal Medicine

## 2020-04-04 ENCOUNTER — Encounter: Payer: Self-pay | Admitting: Internal Medicine

## 2020-04-04 ENCOUNTER — Ambulatory Visit (INDEPENDENT_AMBULATORY_CARE_PROVIDER_SITE_OTHER): Payer: BC Managed Care – PPO | Admitting: Internal Medicine

## 2020-04-04 ENCOUNTER — Other Ambulatory Visit: Payer: Self-pay

## 2020-04-04 VITALS — BP 122/80 | HR 78 | Temp 98.1°F | Ht 66.0 in | Wt 226.4 lb

## 2020-04-04 DIAGNOSIS — Z125 Encounter for screening for malignant neoplasm of prostate: Secondary | ICD-10-CM

## 2020-04-04 DIAGNOSIS — Z23 Encounter for immunization: Secondary | ICD-10-CM

## 2020-04-04 DIAGNOSIS — Z0000 Encounter for general adult medical examination without abnormal findings: Secondary | ICD-10-CM | POA: Diagnosis not present

## 2020-04-04 DIAGNOSIS — Z79899 Other long term (current) drug therapy: Secondary | ICD-10-CM

## 2020-04-04 DIAGNOSIS — R399 Unspecified symptoms and signs involving the genitourinary system: Secondary | ICD-10-CM

## 2020-04-04 DIAGNOSIS — I10 Essential (primary) hypertension: Secondary | ICD-10-CM | POA: Diagnosis not present

## 2020-04-04 DIAGNOSIS — E785 Hyperlipidemia, unspecified: Secondary | ICD-10-CM | POA: Diagnosis not present

## 2020-04-04 DIAGNOSIS — Z8546 Personal history of malignant neoplasm of prostate: Secondary | ICD-10-CM | POA: Diagnosis not present

## 2020-04-04 DIAGNOSIS — E1165 Type 2 diabetes mellitus with hyperglycemia: Secondary | ICD-10-CM

## 2020-04-04 LAB — CBC WITH DIFFERENTIAL/PLATELET
Basophils Absolute: 0.1 10*3/uL (ref 0.0–0.1)
Basophils Relative: 0.7 % (ref 0.0–3.0)
Eosinophils Absolute: 0.4 10*3/uL (ref 0.0–0.7)
Eosinophils Relative: 4.6 % (ref 0.0–5.0)
HCT: 41.2 % (ref 39.0–52.0)
Hemoglobin: 13.6 g/dL (ref 13.0–17.0)
Lymphocytes Relative: 22.7 % (ref 12.0–46.0)
Lymphs Abs: 1.7 10*3/uL (ref 0.7–4.0)
MCHC: 33 g/dL (ref 30.0–36.0)
MCV: 83.7 fl (ref 78.0–100.0)
Monocytes Absolute: 0.8 10*3/uL (ref 0.1–1.0)
Monocytes Relative: 10.4 % (ref 3.0–12.0)
Neutro Abs: 4.7 10*3/uL (ref 1.4–7.7)
Neutrophils Relative %: 61.6 % (ref 43.0–77.0)
Platelets: 332 10*3/uL (ref 150.0–400.0)
RBC: 4.92 Mil/uL (ref 4.22–5.81)
RDW: 14.4 % (ref 11.5–15.5)
WBC: 7.7 10*3/uL (ref 4.0–10.5)

## 2020-04-04 LAB — HEPATIC FUNCTION PANEL
ALT: 17 U/L (ref 0–53)
AST: 14 U/L (ref 0–37)
Albumin: 4 g/dL (ref 3.5–5.2)
Alkaline Phosphatase: 60 U/L (ref 39–117)
Bilirubin, Direct: 0.1 mg/dL (ref 0.0–0.3)
Total Bilirubin: 0.6 mg/dL (ref 0.2–1.2)
Total Protein: 7.1 g/dL (ref 6.0–8.3)

## 2020-04-04 LAB — BASIC METABOLIC PANEL
BUN: 15 mg/dL (ref 6–23)
CO2: 33 mEq/L — ABNORMAL HIGH (ref 19–32)
Calcium: 9.4 mg/dL (ref 8.4–10.5)
Chloride: 97 mEq/L (ref 96–112)
Creatinine, Ser: 1.06 mg/dL (ref 0.40–1.50)
GFR: 71.19 mL/min (ref >60.00)
Glucose, Bld: 105 mg/dL — ABNORMAL HIGH (ref 70–99)
Potassium: 4.2 mEq/L (ref 3.5–5.1)
Sodium: 139 mEq/L (ref 135–145)

## 2020-04-04 LAB — PSA: PSA: 0.02 ng/mL — ABNORMAL LOW (ref 0.10–4.00)

## 2020-04-04 LAB — MICROALBUMIN / CREATININE URINE RATIO
Creatinine,U: 169.4 mg/dL
Microalb Creat Ratio: 1 mg/g (ref 0.0–30.0)
Microalb, Ur: 1.7 mg/dL (ref 0.0–1.9)

## 2020-04-04 LAB — LIPID PANEL
Cholesterol: 136 mg/dL (ref 0–200)
HDL: 37.8 mg/dL — ABNORMAL LOW (ref >39.00)
LDL Cholesterol: 65 mg/dL (ref 0–99)
NonHDL: 98.21
Total CHOL/HDL Ratio: 4
Triglycerides: 164 mg/dL — ABNORMAL HIGH (ref 0.0–149.0)
VLDL: 32.8 mg/dL (ref 0.0–40.0)

## 2020-04-04 LAB — HEMOGLOBIN A1C: Hgb A1c MFr Bld: 7 % — ABNORMAL HIGH (ref 4.6–6.5)

## 2020-04-04 NOTE — Patient Instructions (Signed)
Will notify you  of labs when available.   Get appt with urology  For the urine sx  Sometimes PT is helpful .   Continued attention to  lifestyle intervention healthy eating and exercise .  Healthy weight loss  Will help.   shingrix given today   I think you had the zostavax 4 years ago .    Should have a second shingrix  In 6 months and then will be complete.     Health Maintenance, Male Adopting a healthy lifestyle and getting preventive care are important in promoting health and wellness. Ask your health care provider about:  The right schedule for you to have regular tests and exams.  Things you can do on your own to prevent diseases and keep yourself healthy. What should I know about diet, weight, and exercise? Eat a healthy diet  Eat a diet that includes plenty of vegetables, fruits, low-fat dairy products, and lean protein.  Do not eat a lot of foods that are high in solid fats, added sugars, or sodium.   Maintain a healthy weight Body mass index (BMI) is a measurement that can be used to identify possible weight problems. It estimates body fat based on height and weight. Your health care provider can help determine your BMI and help you achieve or maintain a healthy weight. Get regular exercise Get regular exercise. This is one of the most important things you can do for your health. Most adults should:  Exercise for at least 150 minutes each week. The exercise should increase your heart rate and make you sweat (moderate-intensity exercise).  Do strengthening exercises at least twice a week. This is in addition to the moderate-intensity exercise.  Spend less time sitting. Even light physical activity can be beneficial. Watch cholesterol and blood lipids Have your blood tested for lipids and cholesterol at 71 years of age, then have this test every 5 years. You may need to have your cholesterol levels checked more often if:  Your lipid or cholesterol levels are  high.  You are older than 71 years of age.  You are at high risk for heart disease. What should I know about cancer screening? Many types of cancers can be detected early and may often be prevented. Depending on your health history and family history, you may need to have cancer screening at various ages. This may include screening for:  Colorectal cancer.  Prostate cancer.  Skin cancer.  Lung cancer. What should I know about heart disease, diabetes, and high blood pressure? Blood pressure and heart disease  High blood pressure causes heart disease and increases the risk of stroke. This is more likely to develop in people who have high blood pressure readings, are of African descent, or are overweight.  Talk with your health care provider about your target blood pressure readings.  Have your blood pressure checked: ? Every 3-5 years if you are 70-33 years of age. ? Every year if you are 82 years old or older.  If you are between the ages of 24 and 13 and are a current or former smoker, ask your health care provider if you should have a one-time screening for abdominal aortic aneurysm (AAA). Diabetes Have regular diabetes screenings. This checks your fasting blood sugar level. Have the screening done:  Once every three years after age 79 if you are at a normal weight and have a low risk for diabetes.  More often and at a younger age if you are overweight  or have a high risk for diabetes. What should I know about preventing infection? Hepatitis B If you have a higher risk for hepatitis B, you should be screened for this virus. Talk with your health care provider to find out if you are at risk for hepatitis B infection. Hepatitis C Blood testing is recommended for:  Everyone born from [birth year] through 1966.  Anyone with known risk factors for hepatitis C. Sexually transmitted infections (STIs)  You should be screened each year for STIs, including gonorrhea and chlamydia,  if: ? You are sexually active and are younger than 71 years of age. ? You are older than 71 years of age and your health care provider tells you that you are at risk for this type of infection. ? Your sexual activity has changed since you were last screened, and you are at increased risk for chlamydia or gonorrhea. Ask your health care provider if you are at risk.  Ask your health care provider about whether you are at high risk for HIV. Your health care provider may recommend a prescription medicine to help prevent HIV infection. If you choose to take medicine to prevent HIV, you should first get tested for HIV. You should then be tested every 3 months for as long as you are taking the medicine. Follow these instructions at home: Lifestyle  Do not use any products that contain nicotine or tobacco, such as cigarettes, e-cigarettes, and chewing tobacco. If you need help quitting, ask your health care provider.  Do not use street drugs.  Do not share needles.  Ask your health care provider for help if you need support or information about quitting drugs. Alcohol use  Do not drink alcohol if your health care provider tells you not to drink.  If you drink alcohol: ? Limit how much you have to 0-2 drinks a day. ? Be aware of how much alcohol is in your drink. In the [place] one drink equals one 12 oz bottle of beer (355 mL), one 5 oz glass of wine (148 mL), or one 1 oz glass of hard liquor (44 mL). General instructions  Schedule regular health, dental, and eye exams.  Stay current with your vaccines.  Tell your health care provider if: ? You often feel depressed. ? You have ever been abused or do not feel safe at home. Summary  Adopting a healthy lifestyle and getting preventive care are important in promoting health and wellness.  Follow your health care provider's instructions about healthy diet, exercising, and getting tested or screened for diseases.  Follow your health care  provider's instructions on monitoring your cholesterol and blood pressure. This information is not intended to replace advice given to you by your health care provider. Make sure you discuss any questions you have with your health care provider. Document Revised: 12/31/2017 Document Reviewed: 12/31/2017 Elsevier Patient Education  2021 [employer].

## 2020-04-04 NOTE — Progress Notes (Signed)
Chief Complaint  Patient presents with  . Annual Exam    HPI: Patient  Daniel LAGE Sr.  70 y.o. comes in today for Preventive Health Care visit  And Chronic disease management BP deosnt check but takes med HLD takes med  BG not checking taking med  No se repoted  LUT since prostate cancer surgery    Ui sx    No change bowel habits   Had  Exercises early but  On going issues .    Health Maintenance  Topic Date Due  . PNA vac Low Risk Adult (2 of 2 - PPSV23) 06/03/2017  . COVID-19 Vaccine (3 - Pfizer risk 4-dose series) 09/19/2019  . FOOT EXAM  03/25/2020  . HEMOGLOBIN A1C  03/30/2020  . COLONOSCOPY (Pts 45-48yrs Insurance coverage will need to be confirmed)  05/05/2020 (Originally 04/12/2017)  . OPHTHALMOLOGY EXAM  06/04/2020 (Originally 02/24/2019)  . Hepatitis C Screening  04/04/2021 (Originally 1950/03/31)  . TETANUS/TDAP  05/13/2022  . INFLUENZA VACCINE  Completed  . HPV VACCINES  Aged Out   Health Maintenance Review LIFESTYLE:  Exercise:  gyme 3 x per week walks every morning  Tobacco/ETS:n Alcohol: n Sugar beverages: ocass   Not at work .  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 GI /GU: No adominal pain, vomiting, change in bowel habits. No blood in the stool.  SKIN/HEME: ,no acute skin rashes suspicious lesions or bleeding. No lymphadenopathy, nodules, masses.  NEURO/ PSYCH:  No neurologic signs such as weakness numbness. No depression anxiety. IMM/ Allergy: No unusual infections.  Allergy .   REST of 12 system review negative except as per HPI   Past Medical History:  Diagnosis Date  . Allergy   . Atypical mole 12/30/2011   Right Side of Back(severe)  . Atypical mole  03/10/2018   Left Side Outer(mild) (free)  . BCC (basal cell carcinoma of skin) 02/18/2019   Behind Right Ear(residual) (excision)  . Complication of anesthesia   . History of kidney stones   . History of renal stone   . Hx of bacterial pneumonia    hosp  2001  . Hx of diverticulitis of colon    hosp 2001  . Hyperlipidemia   . Hypertension   . Nodular basal cell carcinoma (BCC) 01/04/2013   Behind Right Ear  . Prostate CA ([place]) 10-24-11   7'13- bx. of prostate + cancer-surgery planned  . Prostate cancer ([place]) 10/31/2011    Past Surgical History:  Procedure Laterality Date  . EXTRACORPOREAL SHOCK WAVE LITHOTRIPSY Left 05/30/2016   Procedure: LEFT EXTRACORPOREAL SHOCK WAVE LITHOTRIPSY (ESWL);  Surgeon: Cleon Gustin, MD;  Location: [facility];  Service: Urology;  Laterality: Left;  . ROBOT ASSISTED LAPAROSCOPIC RADICAL PROSTATECTOMY  10/28/2011   Procedure: ROBOTIC ASSISTED LAPAROSCOPIC RADICAL PROSTATECTOMY;  Surgeon: Malka So, MD;  Location: [facility];  Service: Urology;  Laterality: N/A;  . SHOULDER SURGERY     lt. arthroscopy  . WISDOM TOOTH EXTRACTION  10-24-11   x2 extracted    Family History  Problem Relation Age of Onset  . Diabetes Mother   . Colon cancer Father   .  Cancer Father        colon  . Diabetes Sister        borderline  . Diabetes Brother        borderline  . ADD / ADHD Son     Social History   Socioeconomic History  . Marital status: Married    Spouse name: Not on file  . Number of children: Not on file  . Years of education: Not on file  . Highest education level: Not on file  Occupational History  . Not on file  Tobacco Use  . Smoking status: Never Smoker  . Smokeless tobacco: Never Used  Vaping Use  . Vaping Use: Never used  Substance and Sexual Activity  . Alcohol use: Yes    Comment: very use 1-2 per yr  . Drug use: Never  . Sexual activity: Not Currently  Other Topics Concern  . Not on file  Social History Narrative   HH o f 2   children 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    Accountant   2 cats and a dog   Social Determinants of Radio broadcast assistant Strain: Not on file  Food Insecurity: Not on file  Transportation Needs: Not on file  Physical Activity: Not on file  Stress: Not on file  Social Connections: Not on file    Outpatient Medications Prior to Visit  Medication Sig Dispense Refill  . atorvastatin (LIPITOR) 20 MG tablet TAKE 1 TABLET BY MOUTH EVERY DAY 90 tablet 0  . fluticasone (FLONASE) 50 MCG/ACT nasal spray PLACE 2 SPRAYS IN EACH NOSTRIL EVERY DAY 48 g 12  . hydrochlorothiazide (HYDRODIURIL) 25 MG tablet TAKE 1 TABLET BY MOUTH EVERY DAY 90 tablet 1  . metFORMIN (GLUCOPHAGE) 500 MG tablet TAKE 2 TABLETS BY MOUTH EVERY MORNING AND 1 TABLET EVERY EVENING 270 tablet 0  . telmisartan (MICARDIS) 80 MG tablet TAKE 1 TABLET BY MOUTH EVERY DAY 90 tablet 1   No facility-administered medications prior to visit.     EXAM:  BP 122/80 (BP Location: Left Arm, Patient Position: Sitting, Cuff Size: Large)   Pulse 78   Temp 98.1 F (36.7 C) (Oral)   Ht 5\' 6"  (1.676 m)   Wt 226 lb 6.4 oz (102.7 kg)   SpO2 97%   BMI 36.54 kg/m   Body mass index is 36.54 kg/m. Wt Readings from Last 3 Encounters:  04/04/20 226 lb 6.4 oz (102.7 kg)  10/01/19 237 lb 9.6 oz (107.8 kg)  03/26/19 238 lb 12.8 oz (108.3 kg)    Physical Exam: Vital signs reviewed ERX:VQMG is a well-developed well-nourished alert cooperative    who appearsr stated age in no acute distress.  HEENT: normocephalic atraumatic , Eyes: PERRL EOM's full, conjunctiva clear, Nares: paten,t no deformity discharge or tenderness., Ears: no deformity EAC's clear  Glasses TMs with normal landmarks. Mouth:masked NECK: supple without masses, thyromegaly or bruits. CHEST/PULM:  Clear to auscultation and percussion breath sounds equal no wheeze , rales or rhonchi. No chest wall deformities or tenderness. CV: PMI is nondisplaced, S1 S2 no gallops, murmurs, rubs. Peripheral  pulses are full without delay.No JVD .  ABDOMEN: Bowel sounds normal nontender  No guard or rebound, no hepato splenomegal no CVA tenderness.  No hernia. Extremtities:  No clubbing cyanosis or edema, no acute joint swelling or redness no focal atrophy NEURO:  Oriented x3, cranial nerves 3-12 appear to be intact, no obvious focal weakness,gait within normal limits no abnormal reflexes or asymmetrical SKIN: No  acute rashes normal turgor, color, no bruising or petechiae. sks   Large soft lipoma like  Area on upper right back  PSYCH: Oriented, good eye contact, no obvious depression anxiety, cognition and judgment appear normal. LN: no cervical axillary inguinal adenopathy Diabetic Foot Exam - Simple   Simple Foot Form Visual Inspection No deformities, no ulcerations, no other skin breakdown bilaterally: Yes Sensation Testing Intact to touch and monofilament testing bilaterally: Yes Pulse Check Posterior Tibialis and Dorsalis pulse intact bilaterally: Yes Comments     Lab Results  Component Value Date   WBC 7.2 08/25/2018   HGB 14.3 08/25/2018   HCT 42.9 08/25/2018   PLT 286.0 08/25/2018   GLUCOSE 111 (H) 03/26/2019   CHOL 151 03/26/2019   TRIG 152.0 (H) 03/26/2019   HDL 38.70 (L) 03/26/2019   LDLCALC 82 03/26/2019   ALT 21 08/25/2018   AST 16 08/25/2018   NA 139 03/26/2019   K 3.7 03/26/2019   CL 100 03/26/2019   CREATININE 1.09 03/26/2019   BUN 15 03/26/2019   CO2 30 03/26/2019   TSH 3.63 03/26/2019   PSA 0.01 (L) 08/25/2018   HGBA1C 6.9 (A) 10/01/2019   MICROALBUR 1.7 04/04/2020    BP Readings from Last 3 Encounters:  04/04/20 122/80  10/01/19 130/74  03/26/19 138/82    Lab plan reviewed with patient   ASSESSMENT AND PLAN:  Discussed the following assessment and plan:    ICD-10-CM   1. Visit for preventive health examination  M22.63 Basic metabolic panel    CBC with Differential/Platelet    Hemoglobin A1c    Hepatic function panel    Lipid panel    PSA     Microalbumin / creatinine urine ratio    Microalbumin / creatinine urine ratio    PSA    Lipid panel    Hepatic function panel    Hemoglobin A1c    CBC with Differential/Platelet    Basic metabolic panel  2. Essential hypertension  F35 Basic metabolic panel    CBC with Differential/Platelet    Hemoglobin A1c    Hepatic function panel    Lipid panel    PSA    Microalbumin / creatinine urine ratio    Microalbumin / creatinine urine ratio    PSA    Lipid panel    Hepatic function panel    Hemoglobin A1c    CBC with Differential/Platelet    Basic metabolic panel  3. Medication management  K56.256 Basic metabolic panel    CBC with Differential/Platelet    Hemoglobin A1c    Hepatic function panel    Lipid panel    PSA    Microalbumin / creatinine urine ratio    Microalbumin / creatinine urine ratio    PSA    Lipid panel    Hepatic function panel    Hemoglobin A1c    CBC with Differential/Platelet    Basic metabolic panel  4. Hyperlipidemia, unspecified hyperlipidemia type  L89.3 Basic metabolic panel    CBC with Differential/Platelet    Hemoglobin A1c    Hepatic function panel    Lipid panel    PSA    Microalbumin / creatinine urine ratio    Microalbumin / creatinine urine ratio    PSA    Lipid panel    Hepatic function panel    Hemoglobin A1c    CBC with Differential/Platelet    Basic metabolic panel  5. Personal history of prostate cancer  T34.28 Basic metabolic panel  CBC with Differential/Platelet    Hemoglobin A1c    Hepatic function panel    Lipid panel    PSA    Microalbumin / creatinine urine ratio    Microalbumin / creatinine urine ratio    PSA    Lipid panel    Hepatic function panel    Hemoglobin A1c    CBC with Differential/Platelet    Basic metabolic panel  6. Type 2 diabetes mellitus with hyperglycemia, without long-term current use of insulin (HCC)  T03.54 Basic metabolic panel    CBC with Differential/Platelet    Hemoglobin A1c    Hepatic  function panel    Lipid panel    PSA    Microalbumin / creatinine urine ratio    Microalbumin / creatinine urine ratio    PSA    Lipid panel    Hepatic function panel    Hemoglobin A1c    CBC with Differential/Platelet    Basic metabolic panel  7. Lower urinary tract symptoms (LUTS)  S56.8 Basic metabolic panel    CBC with Differential/Platelet    Hemoglobin A1c    Hepatic function panel    Lipid panel    PSA    Microalbumin / creatinine urine ratio    Microalbumin / creatinine urine ratio    PSA    Lipid panel    Hepatic function panel    Hemoglobin A1c    CBC with Differential/Platelet    Basic metabolic panel  8. Need for shingles vaccine  Z23 Varicella-zoster vaccine IM  get appt urology may be helped by PT for ui sx    Lab and  Check in 6 mos depending  Return in about 6 months (around 10/05/2020) for depending on results.  Patient Care Team: Luana Tatro, Standley Brooking, MD as PCP - General McKenzie, Candee Furbish, MD as Consulting Physician (Urology) Warren Danes, PA-C as Physician Assistant (Dermatology) Lavonna Monarch, MD as Consulting Physician (Dermatology) Patient Instructions   Will notify you  of labs when available.   Get appt with urology  For the urine sx  Sometimes PT is helpful .   Continued attention to  lifestyle intervention healthy eating and exercise .  Healthy weight loss  Will help.   shingrix given today   I think you had the zostavax 4 years ago .    Should have a second shingrix  In 6 months and then will be complete.     Health Maintenance, Male Adopting a healthy lifestyle and getting preventive care are important in promoting health and wellness. Ask your health care provider about:  The right schedule for you to have regular tests and exams.  Things you can do on your own to prevent diseases and keep yourself healthy. What should I know about diet, weight, and exercise? Eat a healthy diet  Eat a diet that includes plenty of vegetables,  fruits, low-fat dairy products, and lean protein.  Do not eat a lot of foods that are high in solid fats, added sugars, or sodium.   Maintain a healthy weight Body mass index (BMI) is a measurement that can be used to identify possible weight problems. It estimates body fat based on height and weight. Your health care provider can help determine your BMI and help you achieve or maintain a healthy weight. Get regular exercise Get regular exercise. This is one of the most important things you can do for your health. Most adults should:  Exercise for at least 150 minutes each week.  The exercise should increase your heart rate and make you sweat (moderate-intensity exercise).  Do strengthening exercises at least twice a week. This is in addition to the moderate-intensity exercise.  Spend less time sitting. Even light physical activity can be beneficial. Watch cholesterol and blood lipids Have your blood tested for lipids and cholesterol at 71 years of age, then have this test every 5 years. You may need to have your cholesterol levels checked more often if:  Your lipid or cholesterol levels are high.  You are older than 71 years of age.  You are at high risk for heart disease. What should I know about cancer screening? Many types of cancers can be detected early and may often be prevented. Depending on your health history and family history, you may need to have cancer screening at various ages. This may include screening for:  Colorectal cancer.  Prostate cancer.  Skin cancer.  Lung cancer. What should I know about heart disease, diabetes, and high blood pressure? Blood pressure and heart disease  High blood pressure causes heart disease and increases the risk of stroke. This is more likely to develop in people who have high blood pressure readings, are of African descent, or are overweight.  Talk with your health care provider about your target blood pressure readings.  Have your  blood pressure checked: ? Every 3-5 years if you are 80-31 years of age. ? Every year if you are 20 years old or older.  If you are between the ages of 67 and 78 and are a current or former smoker, ask your health care provider if you should have a one-time screening for abdominal aortic aneurysm (AAA). Diabetes Have regular diabetes screenings. This checks your fasting blood sugar level. Have the screening done:  Once every three years after age 12 if you are at a normal weight and have a low risk for diabetes.  More often and at a younger age if you are overweight or have a high risk for diabetes. What should I know about preventing infection? Hepatitis B If you have a higher risk for hepatitis B, you should be screened for this virus. Talk with your health care provider to find out if you are at risk for hepatitis B infection. Hepatitis C Blood testing is recommended for:  Everyone born from [birth year] through 1966.  Anyone with known risk factors for hepatitis C. Sexually transmitted infections (STIs)  You should be screened each year for STIs, including gonorrhea and chlamydia, if: ? You are sexually active and are younger than 71 years of age. ? You are older than 71 years of age and your health care provider tells you that you are at risk for this type of infection. ? Your sexual activity has changed since you were last screened, and you are at increased risk for chlamydia or gonorrhea. Ask your health care provider if you are at risk.  Ask your health care provider about whether you are at high risk for HIV. Your health care provider may recommend a prescription medicine to help prevent HIV infection. If you choose to take medicine to prevent HIV, you should first get tested for HIV. You should then be tested every 3 months for as long as you are taking the medicine. Follow these instructions at home: Lifestyle  Do not use any products that contain nicotine or tobacco, such as  cigarettes, e-cigarettes, and chewing tobacco. If you need help quitting, ask your health care provider.  Do not use  street drugs.  Do not share needles.  Ask your health care provider for help if you need support or information about quitting drugs. Alcohol use  Do not drink alcohol if your health care provider tells you not to drink.  If you drink alcohol: ? Limit how much you have to 0-2 drinks a day. ? Be aware of how much alcohol is in your drink. In the [place] one drink equals one 12 oz bottle of beer (355 mL), one 5 oz glass of wine (148 mL), or one 1 oz glass of hard liquor (44 mL). General instructions  Schedule regular health, dental, and eye exams.  Stay current with your vaccines.  Tell your health care provider if: ? You often feel depressed. ? You have ever been abused or do not feel safe at home. Summary  Adopting a healthy lifestyle and getting preventive care are important in promoting health and wellness.  Follow your health care provider's instructions about healthy diet, exercising, and getting tested or screened for diseases.  Follow your health care provider's instructions on monitoring your cholesterol and blood pressure. This information is not intended to replace advice given to you by your health care provider. Make sure you discuss any questions you have with your health care provider. Document Revised: 12/31/2017 Document Reviewed: 12/31/2017 Elsevier Patient Education  2021 [place] K. Lillyanne Bradburn [name]

## 2020-04-04 NOTE — Progress Notes (Signed)
So lab work is stable except A1c is now up to 7.0.  Advised try to increase the Metformin to 4 x 500 mg a day can take 2 in the morning and 2 in the evening.  Please change med list and make sure he has enough to take for 500 mg Metformin per day. Increased attention to decrease sugars and simple carbs continue your exercise keep 26-month appointment and we will recheck the A1c then. There are other medications we could add if needed.  And the sugar is not controlled.

## 2020-04-06 ENCOUNTER — Encounter: Payer: Self-pay | Admitting: Gastroenterology

## 2020-04-06 MED ORDER — METFORMIN HCL 500 MG PO TABS: ORAL_TABLET | ORAL | 1 refills | Status: DC

## 2020-04-06 NOTE — Addendum Note (Signed)
Addended by: Nilda Riggs on: 04/06/2020 03:07 PM   Modules accepted: Orders

## 2020-04-28 ENCOUNTER — Other Ambulatory Visit: Payer: Self-pay

## 2020-04-28 ENCOUNTER — Ambulatory Visit (AMBULATORY_SURGERY_CENTER): Payer: BC Managed Care – PPO | Admitting: *Deleted

## 2020-04-28 VITALS — Ht 66.0 in | Wt 221.0 lb

## 2020-04-28 DIAGNOSIS — Z800 Family history of malignant neoplasm of digestive organs: Secondary | ICD-10-CM

## 2020-04-28 DIAGNOSIS — Z86010 Personal history of colonic polyps: Secondary | ICD-10-CM

## 2020-04-28 MED ORDER — PEG 3350-KCL-NA BICARB-NACL 420 G PO SOLR: 4000.0000 mL | Freq: Once | ORAL | 0 refills | Status: AC

## 2020-04-28 NOTE — Progress Notes (Signed)

## 2020-05-12 ENCOUNTER — Other Ambulatory Visit: Payer: Self-pay

## 2020-05-12 ENCOUNTER — Encounter: Payer: Self-pay | Admitting: Gastroenterology

## 2020-05-12 ENCOUNTER — Ambulatory Visit (AMBULATORY_SURGERY_CENTER): Payer: BC Managed Care – PPO | Admitting: Gastroenterology

## 2020-05-12 VITALS — BP 126/62 | HR 69 | Temp 97.5°F | Resp 15 | Ht 66.0 in | Wt 221.0 lb

## 2020-05-12 DIAGNOSIS — D125 Benign neoplasm of sigmoid colon: Secondary | ICD-10-CM

## 2020-05-12 DIAGNOSIS — Z860100 Personal history of colon polyps, unspecified: Secondary | ICD-10-CM

## 2020-05-12 DIAGNOSIS — Z1211 Encounter for screening for malignant neoplasm of colon: Secondary | ICD-10-CM | POA: Diagnosis not present

## 2020-05-12 DIAGNOSIS — K5140 Inflammatory polyps of colon without complications: Secondary | ICD-10-CM

## 2020-05-12 DIAGNOSIS — D120 Benign neoplasm of cecum: Secondary | ICD-10-CM

## 2020-05-12 MED ORDER — SODIUM CHLORIDE 0.9 % IV SOLN: 500.0000 mL | Freq: Once | INTRAVENOUS | Status: AC

## 2020-05-12 NOTE — Op Note (Signed)
[facility] Patient Name: Daniel Randall Procedure Date: 05/12/2020 7:50 AM MRN: [id number] Endoscopist: Remo Lipps P. Havery Moros , MD Age: 70 Referring MD:  Date of Birth: 09-30-1950 Gender: Male Account #: [id number] Procedure:                Colonoscopy Indications:              High risk colon cancer surveillance: Personal                            history of colonic polyps (history of adenoma 2006) Medicines:                Monitored Anesthesia Care Procedure:                Pre-Anesthesia Assessment:                           - Prior to the procedure, a History and Physical                            was performed, and patient medications and                            allergies were reviewed. The patient's tolerance of                            previous anesthesia was also reviewed. The risks                            and benefits of the procedure and the sedation                            options and risks were discussed with the patient.                            All questions were answered, and informed consent                            was obtained. Prior Anticoagulants: The patient has                            taken no previous anticoagulant or antiplatelet                            agents. ASA Grade Assessment: II - A patient with                            mild systemic disease. After reviewing the risks                            and benefits, the patient was deemed in                            satisfactory condition to undergo the procedure.  After obtaining informed consent, the colonoscope                            was passed under direct vision. Throughout the                            procedure, the patient's blood pressure, pulse, and                            oxygen saturations were monitored continuously. The                            Olympus [id number]) Colonoscope was                            introduced  through the anus and advanced to the the                            cecum, identified by appendiceal orifice and                            ileocecal valve. The colonoscopy was performed                            without difficulty. The patient tolerated the                            procedure well. The quality of the bowel                            preparation was adequate. The ileocecal valve,                            appendiceal orifice, and rectum were photographed. Scope In: 8:37:46 AM Scope Out: 8:59:24 AM Scope Withdrawal Time: 0 hours 18 minutes 49 seconds  Total Procedure Duration: 0 hours 21 minutes 38 seconds  Findings:                 The perianal and digital rectal examinations were                            normal.                           A 3 mm polyp was found in the cecum. The polyp was                            sessile. The polyp was removed with a cold snare.                            Resection and retrieval were complete.                           Many medium-mouthed diverticula were found in the  entire colon.                           Altered mucosa was found in the sigmoid colon in                            the setting of diverticulosis. Superficial erythema                            with loss of normal vascularity. Suspect likely                            reactive due to diverticulosis, although one area                            slightly polypoid. Biopsies were taken with a cold                            forceps for histology, ensure no adenomatous change.                           Internal hemorrhoids were found during retroflexion.                           The exam was otherwise without abnormality. Complications:            No immediate complications. Estimated blood loss:                            Minimal. Estimated Blood Loss:     Estimated blood loss was minimal. Impression:               - One 3 mm polyp in the  cecum, removed with a cold                            snare. Resected and retrieved.                           - Diverticulosis in the entire examined colon.                           - Altered mucosa in the sigmoid colon, I suspect                            likely reactive due to diverticulosis. Biopsied.                           - Internal hemorrhoids.                           - The examination was otherwise normal. Recommendation:           - Patient has a contact number available for                            emergencies. The signs and  symptoms of potential                            delayed complications were discussed with the                            patient. Return to normal activities tomorrow.                            Written discharge instructions were provided to the                            patient.                           - Resume previous diet.                           - Continue present medications.                           - Await pathology results. Remo Lipps P. Fernanda Twaddell, MD 05/12/2020 9:04:16 AM This report has been signed electronically.

## 2020-05-12 NOTE — Progress Notes (Signed)
Called to room to assist during endoscopic procedure.  Patient ID and intended procedure confirmed with present staff. Received instructions for my participation in the procedure from the performing physician.  

## 2020-05-12 NOTE — Patient Instructions (Signed)
Resume previous diet and medications. Await pathology results.  YOU HAD AN ENDOSCOPIC PROCEDURE TODAY AT THE [facility]:   Refer to the procedure report that was given to you for any specific questions about what was found during the examination.  If the procedure report does not answer your questions, please call your gastroenterologist to clarify.  If you requested that your care partner not be given the details of your procedure findings, then the procedure report has been included in a sealed envelope for you to review at your convenience later.  YOU SHOULD EXPECT: Some feelings of bloating in the abdomen. Passage of more gas than usual.  Walking can help get rid of the air that was put into your GI tract during the procedure and reduce the bloating. If you had a lower endoscopy (such as a colonoscopy or flexible sigmoidoscopy) you may notice spotting of blood in your stool or on the toilet paper. If you underwent a bowel prep for your procedure, you may not have a normal bowel movement for a few days.  Please Note:  You might notice some irritation and congestion in your nose or some drainage.  This is from the oxygen used during your procedure.  There is no need for concern and it should clear up in a day or so.  SYMPTOMS TO REPORT IMMEDIATELY:   Following lower endoscopy (colonoscopy or flexible sigmoidoscopy):  Excessive amounts of blood in the stool  Significant tenderness or worsening of abdominal pains  Swelling of the abdomen that is new, acute  Fever of 100F or higher   For urgent or emergent issues, a gastroenterologist can be reached at any hour by calling [phone]. Do not use MyChart messaging for urgent concerns.    DIET:  We do recommend a small meal at first, but then you may proceed to your regular diet.  Drink plenty of fluids but you should avoid alcoholic beverages for 24 hours.  ACTIVITY:  You should plan to take it easy for the rest of today and  you should NOT DRIVE or use heavy machinery until tomorrow (because of the sedation medicines used during the test).    FOLLOW UP: Our staff will call the number listed on your records 48-72 hours following your procedure to check on you and address any questions or concerns that you may have regarding the information given to you following your procedure. If we do not reach you, we will leave a message.  We will attempt to reach you two times.  During this call, we will ask if you have developed any symptoms of COVID 19. If you develop any symptoms (ie: fever, flu-like symptoms, shortness of breath, cough etc.) before then, please call [phone].  If you test positive for Covid 19 in the 2 weeks post procedure, please call and report this information to us.    If any biopsies were taken you will be contacted by phone or by letter within the next 1-3 weeks.  Please call us at [phone] if you have not heard about the biopsies in 3 weeks.    SIGNATURES/CONFIDENTIALITY: You and/or your care partner have signed paperwork which will be entered into your electronic medical record.  These signatures attest to the fact that that the information above on your After Visit Summary has been reviewed and is understood.  Full responsibility of the confidentiality of this discharge information lies with you and/or your care-partner. 

## 2020-05-12 NOTE — Progress Notes (Signed)
Pt's states no medical or surgical changes since previsit or office visit. 

## 2020-05-12 NOTE — Progress Notes (Signed)
A/ox3, pleased with MAC, report to RN 

## 2020-05-16 ENCOUNTER — Telehealth: Payer: Self-pay

## 2020-05-16 ENCOUNTER — Telehealth: Payer: Self-pay | Admitting: *Deleted

## 2020-05-16 NOTE — Telephone Encounter (Signed)
Follow up call made. No voice mailbox set up.

## 2020-05-16 NOTE — Telephone Encounter (Signed)
Attempted to reach patient for post-procedure f/u call. No answer. Unable to leave message, voicemail not set-up according to automated message.

## 2020-05-18 ENCOUNTER — Encounter: Payer: Self-pay | Admitting: Internal Medicine

## 2020-05-18 ENCOUNTER — Other Ambulatory Visit (HOSPITAL_COMMUNITY): Payer: Self-pay

## 2020-05-18 ENCOUNTER — Telehealth (INDEPENDENT_AMBULATORY_CARE_PROVIDER_SITE_OTHER): Payer: BC Managed Care – PPO | Admitting: Internal Medicine

## 2020-05-18 DIAGNOSIS — U071 COVID-19: Secondary | ICD-10-CM

## 2020-05-18 DIAGNOSIS — Z79899 Other long term (current) drug therapy: Secondary | ICD-10-CM | POA: Diagnosis not present

## 2020-05-18 MED ORDER — NIRMATRELVIR/RITONAVIR (PAXLOVID)TABLET
ORAL_TABLET | ORAL | 0 refills | Status: DC
  Filled 2020-05-18: qty 30, 5d supply, fill #0

## 2020-05-18 NOTE — Progress Notes (Signed)
Virtual Visit via Video Note  I connected with@ on 05/18/20 at  9:00 AM EDT by a video enabled telemedicine application and verified that I am speaking with the correct person using two identifiers. Location patient: home Location provider: home office Persons participating in the virtual visit: patient, provider  [place]th national recommendations  regarding COVID 19 pandemic   video visit is advised over in office visit for this patient.  Patient aware  of the limitations of evaluation and management by telemedicine and  availability of in person appointments. and agreed to proceed.   HPI: Daniel FASON Sr. presents for video visit presents for acute visit today.  Yesterday a.m. he began feeling badly with a headache and a sore throat that is since resolved legs felt tight did COVID testing last night that was positive he took Advil last night and this morning at direction of the nursing service.  Today he feels somewhat better but still is achy and has upper respiratory congestion.  No real chest pain or serious cough. Recent exposures work with few people had a colonoscopy 5 days ago polyp removed [employer] occasionally and around family members who are not ill. He has been  immunized last immunization about July.    ROS: See pertinent positives and negatives per HPI.  Past Medical History:  Diagnosis Date  . Allergy   . Atypical mole 12/30/2011   Right Side of Back(severe)  . Atypical mole 03/10/2018   Left Side Outer(mild) (free)  . BCC (basal cell carcinoma of skin) 02/18/2019   Behind Right Ear(residual) (excision)  . Cataract    left eye removed with lens implant   . Chronic kidney disease    kidney stones   . Complication of anesthesia    Pt unaware of issue- denies difficult intubation, denies PONV   . History of kidney stones   . History of renal stone   . Hx of bacterial pneumonia    hosp  2001  . Hx of diverticulitis of colon    hosp 2001  . Hyperlipidemia   .  Hypertension   . Nodular basal cell carcinoma (BCC) 01/04/2013   Behind Right Ear  . Pre-diabetes   . Prostate CA ([place]) 10-24-11   7'13- bx. of prostate + cancer-surgery planned  . Prostate cancer ([place]) 10/31/2011    Past Surgical History:  Procedure Laterality Date  . COLONOSCOPY    . EXTRACORPOREAL SHOCK WAVE LITHOTRIPSY Left 05/30/2016   Procedure: LEFT EXTRACORPOREAL SHOCK WAVE LITHOTRIPSY (ESWL);  Surgeon: Cleon Gustin, MD;  Location: [facility];  Service: Urology;  Laterality: Left;  . POLYPECTOMY    . ROBOT ASSISTED LAPAROSCOPIC RADICAL PROSTATECTOMY  10/28/2011   Procedure: ROBOTIC ASSISTED LAPAROSCOPIC RADICAL PROSTATECTOMY;  Surgeon: Malka So, MD;  Location: [facility];  Service: Urology;  Laterality: N/A;  . SHOULDER SURGERY     lt. arthroscopy  . WISDOM TOOTH EXTRACTION  10-24-11   x2 extracted    Family History  Problem Relation Age of Onset  . Diabetes Mother   . Colon cancer Father 67       age dx'd 75- died 29 months later   . Diabetes Sister        borderline  . Diabetes Brother        borderline  . ADD / ADHD Son   . Colon polyps Neg Hx   . Esophageal cancer Neg Hx   . Rectal cancer Neg Hx   . Stomach cancer Neg Hx  Social History   Tobacco Use  . Smoking status: Never Smoker  . Smokeless tobacco: Never Used  Vaping Use  . Vaping Use: Never used  Substance Use Topics  . Alcohol use: Yes    Comment: very use 1-2 per yr  . Drug use: Never      Current Outpatient Medications:  .  atorvastatin (LIPITOR) 20 MG tablet, TAKE 1 TABLET BY MOUTH EVERY DAY, Disp: 90 tablet, Rfl: 0 .  fluticasone (FLONASE) 50 MCG/ACT nasal spray, PLACE 2 SPRAYS IN EACH NOSTRIL EVERY DAY, Disp: 48 g, Rfl: 12 .  hydrochlorothiazide (HYDRODIURIL) 25 MG tablet, TAKE 1 TABLET BY MOUTH EVERY DAY, Disp: 90 tablet, Rfl: 1 .  metFORMIN (GLUCOPHAGE) 500 MG tablet, Take 2 tablets by mouth every morning and 2 tablets every evening., Disp: 360 tablet, Rfl: 1 .   nirmatrelvir/ritonavir EUA (PAXLOVID) TABS, Take 3 tablets by mouth twice daily for 5 days, Disp: 30 tablet, Rfl: 0 .  telmisartan (MICARDIS) 80 MG tablet, TAKE 1 TABLET BY MOUTH EVERY DAY, Disp: 90 tablet, Rfl: 1  Current Facility-Administered Medications:  .  0.9 %  sodium chloride infusion, 500 mL, Intravenous, Once, Armbruster, Carlota Raspberry, MD  EXAM: BP Readings from Last 3 Encounters:  05/12/20 126/62  04/04/20 122/80  10/01/19 130/74    VITALS per patient if applicable:  GENERAL: alert, oriented, appears well and in no acute distress  HEENT: atraumatic, conjunttiva clear, no obvious abnormalities on inspection of external nose and ears  NECK: normal movements of the head and neck  LUNGS: on inspection no signs of respiratory distress, breathing rate appears normal, no obvious gross SOB, gasping or wheezing  CV: no obvious cyanosis  MS: moves all visible extremities without noticeable abnormality  PSYCH/NEURO: pleasant and cooperative, no obvious depression or anxiety, speech and thought processing grossly intact Lab Results  Component Value Date   WBC 7.7 04/04/2020   HGB 13.6 04/04/2020   HCT 41.2 04/04/2020   PLT 332.0 04/04/2020   GLUCOSE 105 (H) 04/04/2020   CHOL 136 04/04/2020   TRIG 164.0 (H) 04/04/2020   HDL 37.80 (L) 04/04/2020   LDLCALC 65 04/04/2020   ALT 17 04/04/2020   AST 14 04/04/2020   NA 139 04/04/2020   K 4.2 04/04/2020   CL 97 04/04/2020   CREATININE 1.06 04/04/2020   BUN 15 04/04/2020   CO2 33 (H) 04/04/2020   TSH 3.63 03/26/2019   PSA 0.02 (L) 04/04/2020   HGBA1C 7.0 (H) 04/04/2020   MICROALBUR 1.7 04/04/2020    ASSESSMENT AND PLAN:  Discussed the following assessment and plan:    ICD-10-CM   1. COVID-19 virus infection  U07.1    w risk factore   2. Medication management  Z79.899    Risk factors reviewed immunized  Day 2  Advise  paxlovid   Nl gfr    Expectant management.  Isolation for 10 d from sx .  I advise he proceed with  antiviral therapy.  And contact us of any signs of concern or alarm complication. etc    Risk benefit of medication discussed.  Counseled.   Expectant management and discussion of plan and treatment with opportunity to ask questions and all were answered. The patient agreed with the plan and demonstrated an understanding of the instructions.   Advised to call back or seek an in-person evaluation if worsening  or having  further concerns . Return if symptoms worsen or fail to improve. Shanon Ace, MD

## 2020-05-19 ENCOUNTER — Other Ambulatory Visit: Payer: Self-pay | Admitting: Internal Medicine

## 2020-06-07 ENCOUNTER — Other Ambulatory Visit: Payer: Self-pay | Admitting: Dermatology

## 2020-06-07 ENCOUNTER — Ambulatory Visit: Payer: BC Managed Care – PPO | Admitting: Dermatology

## 2020-06-07 ENCOUNTER — Other Ambulatory Visit: Payer: Self-pay

## 2020-06-07 ENCOUNTER — Encounter: Payer: Self-pay | Admitting: Dermatology

## 2020-06-07 DIAGNOSIS — D485 Neoplasm of uncertain behavior of skin: Secondary | ICD-10-CM

## 2020-06-07 DIAGNOSIS — L570 Actinic keratosis: Secondary | ICD-10-CM | POA: Diagnosis not present

## 2020-06-07 DIAGNOSIS — Z1283 Encounter for screening for malignant neoplasm of skin: Secondary | ICD-10-CM

## 2020-06-07 DIAGNOSIS — B079 Viral wart, unspecified: Secondary | ICD-10-CM | POA: Diagnosis not present

## 2020-06-07 DIAGNOSIS — D171 Benign lipomatous neoplasm of skin and subcutaneous tissue of trunk: Secondary | ICD-10-CM

## 2020-06-07 DIAGNOSIS — L821 Other seborrheic keratosis: Secondary | ICD-10-CM

## 2020-06-07 DIAGNOSIS — L820 Inflamed seborrheic keratosis: Secondary | ICD-10-CM

## 2020-06-21 ENCOUNTER — Encounter: Payer: Self-pay | Admitting: Dermatology

## 2020-06-21 NOTE — Progress Notes (Signed)
   Follow-Up Visit   Subjective  Daniel Randall. is a 70 y.o. male who presents for the following: Annual Exam (Right collar bone -+ itch).  General skin examination, change in lesion right collarbone Location:  Duration:  Quality:  Associated Signs/Symptoms: Modifying Factors:  Severity:  Timing: Context:   Objective  Well appearing patient in no apparent distress; mood and affect are within normal limits. Objective  Left Abdomen (side) - Upper: Waist up skin examination; no atypical pigmented lesions  Objective  Right Upper Back: 5 cm doughy subcutaneous nonfluctuant nontender nodule.  Historically stable  Objective  Left Parietal Scalp: Pink hornlike 3 mm crust  Objective  Right chest: Pink central crusting and very brown seborrheic keratosis; dermoscopy favors I SK over current CIS.  Biopsy indicated.       All skin waist up examined.   Assessment & Plan    Encounter for screening for malignant neoplasm of skin Left Abdomen (side) - Upper  Annual skin examination.  Lipoma of torso Right Upper Back  Intervention not necessary.  AK (actinic keratosis) Left Parietal Scalp  Destruction of lesion - Left Parietal Scalp Complexity: simple   Destruction method: cryotherapy   Informed consent: discussed and consent obtained   Timeout:  patient name, date of birth, surgical site, and procedure verified Lesion destroyed using liquid nitrogen: Yes   Cryotherapy cycles:  3 Outcome: patient tolerated procedure well with no complications    Viral warts, unspecified type Right Lower Leg - Anterior  Seborrheic keratosis Neck - Posterior  Neoplasm of uncertain behavior of skin Right chest  Skin / nail biopsy Type of biopsy: tangential   Informed consent: discussed and consent obtained   Timeout: patient name, date of birth, surgical site, and procedure verified   Procedure prep:  Patient was prepped and draped in usual sterile fashion (Non  sterile) Prep type:  Chlorhexidine Anesthesia: the lesion was anesthetized in a standard fashion   Anesthetic:  1% lidocaine w/ epinephrine 1-100,000 local infiltration Instrument used: flexible razor blade   Outcome: patient tolerated procedure well   Post-procedure details: wound care instructions given    Specimen 1 - Surgical pathology Differential Diagnosis: r/o sk  Check Margins: No      I, Lavonna Monarch, MD, have reviewed all documentation for this visit.  The documentation on 06/21/20 for the exam, diagnosis, procedures, and orders are all accurate and complete.

## 2020-08-21 ENCOUNTER — Other Ambulatory Visit: Payer: Self-pay | Admitting: Internal Medicine

## 2020-10-09 ENCOUNTER — Ambulatory Visit: Payer: BC Managed Care – PPO | Admitting: Dermatology

## 2020-10-09 ENCOUNTER — Other Ambulatory Visit: Payer: Self-pay

## 2020-10-09 ENCOUNTER — Encounter: Payer: Self-pay | Admitting: Dermatology

## 2020-10-09 DIAGNOSIS — Z1283 Encounter for screening for malignant neoplasm of skin: Secondary | ICD-10-CM | POA: Diagnosis not present

## 2020-10-09 DIAGNOSIS — B079 Viral wart, unspecified: Secondary | ICD-10-CM | POA: Diagnosis not present

## 2020-10-09 DIAGNOSIS — L570 Actinic keratosis: Secondary | ICD-10-CM

## 2020-10-09 DIAGNOSIS — L821 Other seborrheic keratosis: Secondary | ICD-10-CM | POA: Diagnosis not present

## 2020-10-09 DIAGNOSIS — D485 Neoplasm of uncertain behavior of skin: Secondary | ICD-10-CM

## 2020-10-09 NOTE — Patient Instructions (Signed)

## 2020-10-10 ENCOUNTER — Ambulatory Visit: Payer: BC Managed Care – PPO | Admitting: Internal Medicine

## 2020-10-10 ENCOUNTER — Encounter: Payer: Self-pay | Admitting: Internal Medicine

## 2020-10-10 VITALS — BP 136/80 | HR 90 | Temp 98.7°F | Ht 66.0 in | Wt 221.2 lb

## 2020-10-10 DIAGNOSIS — Z79899 Other long term (current) drug therapy: Secondary | ICD-10-CM | POA: Diagnosis not present

## 2020-10-10 DIAGNOSIS — Z8616 Personal history of COVID-19: Secondary | ICD-10-CM | POA: Diagnosis not present

## 2020-10-10 DIAGNOSIS — Z23 Encounter for immunization: Secondary | ICD-10-CM | POA: Diagnosis not present

## 2020-10-10 DIAGNOSIS — E1165 Type 2 diabetes mellitus with hyperglycemia: Secondary | ICD-10-CM

## 2020-10-10 LAB — POCT GLYCOSYLATED HEMOGLOBIN (HGB A1C): Hemoglobin A1C: 6.3 % — AB (ref 4.0–5.6)

## 2020-10-10 NOTE — Progress Notes (Signed)
Chief Complaint  Patient presents with   Follow-up     HPI: Daniel GEFFRE Sr. 70 y.o. come in for Chronic disease management  Had covid 4 22  lost 7 # astill eating some better   FU for for BG control taking metformin 2 -1  back to gym To retire 12 22   Needs flu shot today  ROS: See pertinent positives and negatives per HPI.  Past Medical History:  Diagnosis Date   Allergy    Atypical mole 12/30/2011   Right Side of Back(severe)   Atypical mole 03/10/2018   Left Side Outer(mild) (free)   BCC (basal cell carcinoma of skin) 02/18/2019   Behind Right Ear(residual) (excision)   Cataract    left eye removed with lens implant    Chronic kidney disease    kidney stones    Complication of anesthesia    Pt unaware of issue- denies difficult intubation, denies PONV    History of kidney stones    History of renal stone    Hx of bacterial pneumonia    hosp  2001   Hx of diverticulitis of colon    hosp 2001   Hyperlipidemia    Hypertension    Nodular basal cell carcinoma (BCC) 01/04/2013   Behind Right Ear   Pre-diabetes    Prostate CA ([place]) 10-24-11   7'13- bx. of prostate + cancer-surgery planned   Prostate cancer ([place]) 10/31/2011    Family History  Problem Relation Age of Onset   Diabetes Mother    Colon cancer Father 21       age dx'd 48- died 36 months later    Diabetes Sister        borderline   Diabetes Brother        borderline   ADD / ADHD Son    Colon polyps Neg Hx    Esophageal cancer Neg Hx    Rectal cancer Neg Hx    Stomach cancer Neg Hx     Social History   Socioeconomic History   Marital status: Married    Spouse name: Not on file   Number of children: Not on file   Years of education: Not on file   Highest education level: Not on file  Occupational History   Not on file  Tobacco Use   Smoking status: Never   Smokeless tobacco: Never  Vaping Use   Vaping Use: Never used  Substance and Sexual Activity   Alcohol use: Yes    Comment:  very use 1-2 per yr   Drug use: Never   Sexual activity: Not Currently  Other Topics Concern   Not on file  Social History Narrative   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    Accountant   2 cats and a dog   Social Determinants of Radio broadcast assistant Strain: Not on file  Food Insecurity: Not on file  Transportation Needs: Not on file  Physical Activity: Not on file  Stress: Not on file  Social Connections: Not on file    Outpatient Medications Prior to Visit  Medication Sig Dispense Refill   atorvastatin (LIPITOR) 20 MG tablet TAKE 1 TABLET poqd 90 tablet 1   fluticasone (FLONASE) 50 MCG/ACT nasal spray PLACE 2 SPRAYS IN EACH NOSTRIL EVERY DAY 48 g 12   hydrochlorothiazide (HYDRODIURIL) 25 MG tablet TAKE 1 TABLET BY MOUTH EVERY DAY 90 tablet 1   metFORMIN (GLUCOPHAGE) 500 MG tablet Take  2 tablets by mouth every morning and 2 tablets every evening. 360 tablet 1   telmisartan (MICARDIS) 80 MG tablet TAKE 1 TABLET BY MOUTH EVERY DAY 90 tablet 1   nirmatrelvir/ritonavir EUA (PAXLOVID) TABS Take 3 tablets by mouth twice daily for 5 days (Patient not taking: Reported on 10/10/2020) 30 tablet 0   Facility-Administered Medications Prior to Visit  Medication Dose Route Frequency Provider Last Rate Last Admin   0.9 %  sodium chloride infusion  500 mL Intravenous Once Armbruster, Carlota Raspberry, MD         EXAM:  BP 136/80 (BP Location: Left Arm, Patient Position: Sitting, Cuff Size: Normal)   Pulse 90   Temp 98.7 F (37.1 C) (Oral)   Ht 5\' 6"  (1.676 m)   Wt 221 lb 3.2 oz (100.3 kg)   SpO2 97%   BMI 35.70 kg/m   Body mass index is 35.7 kg/m. Wt Readings from Last 3 Encounters:  10/10/20 221 lb 3.2 oz (100.3 kg)  05/12/20 221 lb (100.2 kg)  04/28/20 221 lb (100.2 kg)    GENERAL: vitals reviewed and listed above, alert, oriented, appears well hydrated and in no acute distress HEENT: atraumatic, conjunctiva  clear, no obvious abnormalities on inspection of external nose and  ears OP : masked  NECK: no obvious masses on inspection palpation  LUNGS: clear to auscultation bilaterally, no wheezes, rales or rhonchi, good air movement CV: HRRR, no clubbing cyanosis or  peripheral edema nl cap refill  MS: moves all extremities without noticeable focal  abnormality PSYCH: pleasant and cooperative, no obvious depression or anxiety Lab Results  Component Value Date   WBC 7.7 04/04/2020   HGB 13.6 04/04/2020   HCT 41.2 04/04/2020   PLT 332.0 04/04/2020   GLUCOSE 105 (H) 04/04/2020   CHOL 136 04/04/2020   TRIG 164.0 (H) 04/04/2020   HDL 37.80 (L) 04/04/2020   LDLCALC 65 04/04/2020   ALT 17 04/04/2020   AST 14 04/04/2020   NA 139 04/04/2020   K 4.2 04/04/2020   CL 97 04/04/2020   CREATININE 1.06 04/04/2020   BUN 15 04/04/2020   CO2 33 (H) 04/04/2020   TSH 3.63 03/26/2019   PSA 0.02 (L) 04/04/2020   HGBA1C 6.3 (A) 10/10/2020   MICROALBUR 1.7 04/04/2020   BP Readings from Last 3 Encounters:  10/10/20 136/80  05/12/20 126/62  04/04/20 122/80    ASSESSMENT AND PLAN:  Discussed the following assessment and plan:  Type 2 diabetes mellitus with hyperglycemia, without long-term current use of insulin (HCC) - improved from 7 - 6.3 - Plan: POCT glycosylated hemoglobin (Hb A1C)  Medication management - taking metformin  2 and 1   Personal history of COVID-19 - april 2022  Need for influenza vaccination - Plan: Flu Vaccine QUAD High Dose(Fluad)  -Patient advised to return or notify health care team  if  new concerns arise.  Patient Instructions  Good to see you today  Hg a1c is better  6.3 down from 7.0  Continue lifestyle intervention healthy eating and activity .   Make  cpx appt for  March 2023  labs at that visit .  Daniel Randall [name]

## 2020-10-10 NOTE — Patient Instructions (Signed)
Good to see you today  Hg a1c is better  6.3 down from 7.0  Continue lifestyle intervention healthy eating and activity .   Make  cpx appt for  March 2023  labs at that visit .

## 2020-10-16 DIAGNOSIS — Z87442 Personal history of urinary calculi: Secondary | ICD-10-CM | POA: Diagnosis not present

## 2020-10-16 DIAGNOSIS — Z8546 Personal history of malignant neoplasm of prostate: Secondary | ICD-10-CM | POA: Diagnosis not present

## 2020-10-16 DIAGNOSIS — N5231 Erectile dysfunction following radical prostatectomy: Secondary | ICD-10-CM | POA: Diagnosis not present

## 2020-10-16 DIAGNOSIS — N3946 Mixed incontinence: Secondary | ICD-10-CM | POA: Diagnosis not present

## 2020-10-17 ENCOUNTER — Telehealth: Payer: Self-pay

## 2020-10-17 NOTE — Telephone Encounter (Signed)
-----   Message from Yetta Flock, MD sent at 10/17/2020  7:53 AM EDT ----- Herbert Seta can you help book this patient an office follow up with me? Thanks  ----- Message ----- From: Irine Seal, MD Sent: 10/16/2020   5:33 PM EDT To: Burnis Medin, MD, Yetta Flock, MD  Remo Lipps,      I saw Mr. Daniel Randall in the office today with the chief complaint of fecal incontinence that has happened about 3 x in the last 3 months including 2 episodes in the last week or so.  You did a colonoscopy in April on him.   He had increased his fiber intake in the last month or so and also is on metformin with the dose having been increased a few months ago.  I am not sure why he chose to come to me with that chief complaint other than he snuck in some ED issues at the end of the visit, but I recommended he back down the fiber and told him that I would let you know so you could work on getting him in for further management of this issue.  Thanks,  John.

## 2020-10-17 NOTE — Telephone Encounter (Signed)
Spoke with patient, he states that he does not wish to schedule a follow up at this time. He states that he has been eating a high-fiber diet and is going to try and make some adjustments to that to see if that helps. Pt states that he is getting things together so that he can retire and doesn't want to forget the appt. He will call back to schedule appt if needed. Pt had no concerns at the end of the call.

## 2020-10-22 ENCOUNTER — Encounter: Payer: Self-pay | Admitting: Dermatology

## 2020-10-22 NOTE — Progress Notes (Signed)
   Follow-Up Visit   Subjective  Daniel Crystal. is a 70 y.o. male who presents for the following: Annual Exam (No new concerns).  General skin examination, and new growths on his scalp and arm. Location:  Duration:  Quality:  Associated Signs/Symptoms: Modifying Factors:  Severity:  Timing: Context:   Objective  Well appearing patient in no apparent distress; mood and affect are within normal limits. Torso - Posterior (Back) Waist up skin examination no atypical pigmented lesions.  1 possible new nonmelanoma skin cancer right frontal scalp will be biopsied and treated.  Right Frontal Scalp Hornlike 7 mm crust       Left Upper Arm - Anterior Verrucous 5 mm flesh-colored papule    All skin waist up examined.   Assessment & Plan    Encounter for screening for malignant neoplasm of skin Torso - Posterior (Back)  Annual skin examination.  Encouraged to self examine twice annually.  Continue ultraviolet protection.  Seborrheic keratosis Neck - Posterior  Neoplasm of uncertain behavior of skin Right Frontal Scalp  Skin / nail biopsy Type of biopsy: tangential   Informed consent: discussed and consent obtained   Timeout: patient name, date of birth, surgical site, and procedure verified   Procedure prep:  Patient was prepped and draped in usual sterile fashion (Non sterile) Prep type:  Chlorhexidine Anesthesia: the lesion was anesthetized in a standard fashion   Anesthetic:  1% lidocaine w/ epinephrine 1-100,000 local infiltration Instrument used: flexible razor blade   Outcome: patient tolerated procedure well   Post-procedure details: wound care instructions given    Destruction of lesion Complexity: simple   Destruction method: electrodesiccation and curettage   Informed consent: discussed and consent obtained   Timeout:  patient name, date of birth, surgical site, and procedure verified Anesthesia: the lesion was anesthetized in a standard fashion    Anesthetic:  1% lidocaine w/ epinephrine 1-100,000 local infiltration Curettage performed in three different directions: Yes   Curettage cycles:  1 Lesion length (cm):  0.9 Lesion width (cm):  0.9 Margin per side (cm):  0 Final wound size (cm):  0.9 Hemostasis achieved with:  aluminum chloride Outcome: patient tolerated procedure well with no complications   Post-procedure details: wound care instructions given    Specimen 1 - Surgical pathology Differential Diagnosis: bcc vs scc- txpbx  Check Margins: No  After shave biopsy the base of the lesion was curetted and cauterized.  Viral warts, unspecified type Left Upper Arm - Anterior  No intervention initiated       I, Lavonna Monarch, MD, have reviewed all documentation for this visit.  The documentation on 10/22/20 for the exam, diagnosis, procedures, and orders are all accurate and complete.

## 2020-10-30 ENCOUNTER — Other Ambulatory Visit: Payer: Self-pay | Admitting: Internal Medicine

## 2020-11-20 ENCOUNTER — Other Ambulatory Visit: Payer: Self-pay | Admitting: Internal Medicine

## 2020-12-08 DIAGNOSIS — E119 Type 2 diabetes mellitus without complications: Secondary | ICD-10-CM | POA: Diagnosis not present

## 2021-01-10 ENCOUNTER — Emergency Department (HOSPITAL_BASED_OUTPATIENT_CLINIC_OR_DEPARTMENT_OTHER)
Admission: EM | Admit: 2021-01-10 | Discharge: 2021-01-10 | Disposition: A | Discharge status: Home / Self Care | Payer: BC Managed Care – PPO | Attending: Emergency Medicine | Admitting: Emergency Medicine

## 2021-01-10 ENCOUNTER — Emergency Department (HOSPITAL_BASED_OUTPATIENT_CLINIC_OR_DEPARTMENT_OTHER): Payer: BC Managed Care – PPO

## 2021-01-10 ENCOUNTER — Encounter (HOSPITAL_BASED_OUTPATIENT_CLINIC_OR_DEPARTMENT_OTHER): Payer: Self-pay

## 2021-01-10 ENCOUNTER — Other Ambulatory Visit: Payer: Self-pay

## 2021-01-10 DIAGNOSIS — R7303 Prediabetes: Secondary | ICD-10-CM | POA: Insufficient documentation

## 2021-01-10 DIAGNOSIS — Z7984 Long term (current) use of oral hypoglycemic drugs: Secondary | ICD-10-CM | POA: Diagnosis not present

## 2021-01-10 DIAGNOSIS — R1031 Right lower quadrant pain: Secondary | ICD-10-CM | POA: Diagnosis not present

## 2021-01-10 DIAGNOSIS — Z8546 Personal history of malignant neoplasm of prostate: Secondary | ICD-10-CM | POA: Insufficient documentation

## 2021-01-10 DIAGNOSIS — N200 Calculus of kidney: Secondary | ICD-10-CM | POA: Diagnosis not present

## 2021-01-10 DIAGNOSIS — N189 Chronic kidney disease, unspecified: Secondary | ICD-10-CM | POA: Diagnosis not present

## 2021-01-10 DIAGNOSIS — I129 Hypertensive chronic kidney disease with stage 1 through stage 4 chronic kidney disease, or unspecified chronic kidney disease: Secondary | ICD-10-CM | POA: Diagnosis not present

## 2021-01-10 DIAGNOSIS — Z79899 Other long term (current) drug therapy: Secondary | ICD-10-CM | POA: Insufficient documentation

## 2021-01-10 DIAGNOSIS — I700 Atherosclerosis of aorta: Secondary | ICD-10-CM | POA: Diagnosis not present

## 2021-01-10 DIAGNOSIS — Z87442 Personal history of urinary calculi: Secondary | ICD-10-CM | POA: Diagnosis not present

## 2021-01-10 DIAGNOSIS — N201 Calculus of ureter: Secondary | ICD-10-CM

## 2021-01-10 DIAGNOSIS — K5730 Diverticulosis of large intestine without perforation or abscess without bleeding: Secondary | ICD-10-CM | POA: Diagnosis not present

## 2021-01-10 LAB — CBC WITH DIFFERENTIAL/PLATELET
Abs Immature Granulocytes: 0.04 10*3/uL (ref 0.00–0.07)
Basophils Absolute: 0.1 10*3/uL (ref 0.0–0.1)
Basophils Relative: 1 %
Eosinophils Absolute: 0.4 10*3/uL (ref 0.0–0.5)
Eosinophils Relative: 5 %
HCT: 43.1 % (ref 39.0–52.0)
Hemoglobin: 13.8 g/dL (ref 13.0–17.0)
Immature Granulocytes: 1 %
Lymphocytes Relative: 23 %
Lymphs Abs: 2 10*3/uL (ref 0.7–4.0)
MCH: 27 pg (ref 26.0–34.0)
MCHC: 32 g/dL (ref 30.0–36.0)
MCV: 84.2 fL (ref 80.0–100.0)
Monocytes Absolute: 0.8 10*3/uL (ref 0.1–1.0)
Monocytes Relative: 9 %
Neutro Abs: 5.4 10*3/uL (ref 1.7–7.7)
Neutrophils Relative %: 61 %
Platelets: 297 10*3/uL (ref 150–400)
RBC: 5.12 MIL/uL (ref 4.22–5.81)
RDW: 14.2 % (ref 11.5–15.5)
WBC: 8.6 10*3/uL (ref 4.0–10.5)
nRBC: 0 % (ref 0.0–0.2)

## 2021-01-10 LAB — COMPREHENSIVE METABOLIC PANEL
ALT: 15 U/L (ref 0–44)
AST: 16 U/L (ref 15–41)
Albumin: 3.9 g/dL (ref 3.5–5.0)
Alkaline Phosphatase: 59 U/L (ref 38–126)
Anion gap: 12 (ref 5–15)
BUN: 19 mg/dL (ref 8–23)
CO2: 27 mmol/L (ref 22–32)
Calcium: 9.3 mg/dL (ref 8.9–10.3)
Chloride: 99 mmol/L (ref 98–111)
Creatinine, Ser: 1.15 mg/dL (ref 0.61–1.24)
GFR, Estimated: >60 mL/min (ref >60)
Glucose, Bld: 153 mg/dL — ABNORMAL HIGH (ref 70–99)
Potassium: 3.7 mmol/L (ref 3.5–5.1)
Sodium: 138 mmol/L (ref 135–145)
Total Bilirubin: 0.5 mg/dL (ref 0.3–1.2)
Total Protein: 7.1 g/dL (ref 6.5–8.1)

## 2021-01-10 LAB — URINALYSIS, ROUTINE W REFLEX MICROSCOPIC
Bilirubin Urine: NEGATIVE
Glucose, UA: NEGATIVE mg/dL
Ketones, ur: NEGATIVE mg/dL
Leukocytes,Ua: NEGATIVE
Nitrite: NEGATIVE
RBC / HPF: >50 RBC/hpf — ABNORMAL HIGH (ref 0–5)
Specific Gravity, Urine: 1.022 (ref 1.005–1.030)
pH: 6 (ref 5.0–8.0)

## 2021-01-10 MED ORDER — MORPHINE SULFATE (PF) 4 MG/ML IV SOLN
4.0000 mg | Freq: Once | INTRAVENOUS | Status: AC
  Administered 2021-01-10: 07:00:00 4 mg via INTRAVENOUS
  Filled 2021-01-10: qty 1

## 2021-01-10 MED ORDER — TAMSULOSIN HCL 0.4 MG PO CAPS
0.4000 mg | ORAL_CAPSULE | Freq: Every day | ORAL | 0 refills | Status: AC
Start: 2021-01-10 — End: 2021-01-24

## 2021-01-10 MED ORDER — OXYCODONE-ACETAMINOPHEN 5-325 MG PO TABS: 1.0000 | ORAL_TABLET | Freq: Four times a day (QID) | ORAL | 0 refills | Status: DC | PRN

## 2021-01-10 MED ORDER — ONDANSETRON HCL 4 MG/2ML IJ SOLN
4.0000 mg | Freq: Once | INTRAMUSCULAR | Status: AC
  Administered 2021-01-10: 07:00:00 4 mg via INTRAVENOUS
  Filled 2021-01-10: qty 2

## 2021-01-10 MED ORDER — SENNOSIDES-DOCUSATE SODIUM 8.6-50 MG PO TABS: 1.0000 | ORAL_TABLET | Freq: Every evening | ORAL | 0 refills | Status: DC | PRN

## 2021-01-10 NOTE — ED Triage Notes (Signed)
Right lower abdominal pain this morning. Did have two bowel movements this morning. Denies any nausea and vomiting or urinary symptoms.

## 2021-01-10 NOTE — ED Provider Notes (Signed)
[facility] EMERGENCY DEPT Provider Note   CSN: [id number] Arrival date & time: 01/10/21  2703     History Chief Complaint  Patient presents with   Abdominal Pain    Daniel PRESLEY Sr. is a 70 y.o. male.  The history is provided by the patient.  Abdominal Pain Pain location:  R flank and RLQ Pain quality: cramping, shooting and throbbing   Pain radiates to:  R flank Pain severity:  Moderate Onset quality:  Sudden Duration:  4 hours Timing:  Constant Progression:  Unchanged Chronicity:  New Context: awakening from sleep   Context comment:  Was normal yesterday and when he went to bed and woke up at 2am with pain Relieved by:  Nothing Worsened by:  Nothing Ineffective treatments: no worse with eating or positions.  no change with BM or urination. Associated symptoms: nausea   Associated symptoms: no anorexia, no belching, no chest pain, no constipation, no cough, no diarrhea, no dysuria, no fever, no hematuria, no shortness of breath and no vomiting   Risk factors comment:  Hx of kidney stone requiring lithotripsy about 5 years ago, htn, prostate CA     Past Medical History:  Diagnosis Date   Allergy    Atypical mole 12/30/2011   Right Side of Back(severe)   Atypical mole 03/10/2018   Left Side Outer(mild) (free)   BCC (basal cell carcinoma of skin) 02/18/2019   Behind Right Ear(residual) (excision)   Cataract    left eye removed with lens implant    Chronic kidney disease    kidney stones    Complication of anesthesia    Pt unaware of issue- denies difficult intubation, denies PONV    History of kidney stones    History of renal stone    Hx of bacterial pneumonia    hosp  2001   Hx of diverticulitis of colon    hosp 2001   Hyperlipidemia    Hypertension    Nodular basal cell carcinoma (BCC) 01/04/2013   Behind Right Ear   Pre-diabetes    Prostate CA ([place]) 10-24-11   7'13- bx. of prostate + cancer-surgery planned   Prostate cancer ([place])  10/31/2011    Patient Active Problem List   Diagnosis Date Noted   Pre-diabetes 10/01/2019   Personal history of prostate cancer 06/03/2016   Fasting hyperglycemia 05/20/2014   Obesity (BMI 30-39.9) 11/03/2012   Prostate cancer ([place]) 10/31/2011   Visit for preventive health examination 05/07/2011   Allergic rhinitis, cause unspecified 05/07/2011   OBESITY 02/05/2010   HYPERTROPHY PROSTATE W/O UR OBST & OTH LUTS 07/13/2007   ERECTILE DYSFUNCTION, ORGANIC 07/13/2007   DIVERTICULITIS, HX OF 07/13/2007   PSA, INCREASED 07/06/2007   HYPERLIPIDEMIA 12/24/2006   Essential hypertension 12/24/2006   UNSPECIFIED SLEEP DISTURBANCE 12/24/2006   IMPAIRED FASTING GLUCOSE 12/24/2006   NEPHROLITHIASIS, HX OF 12/24/2006    Past Surgical History:  Procedure Laterality Date   COLONOSCOPY     EXTRACORPOREAL SHOCK WAVE LITHOTRIPSY Left 05/30/2016   Procedure: LEFT EXTRACORPOREAL SHOCK WAVE LITHOTRIPSY (ESWL);  Surgeon: Cleon Gustin, MD;  Location: [facility];  Service: Urology;  Laterality: Left;   POLYPECTOMY     ROBOT ASSISTED LAPAROSCOPIC RADICAL PROSTATECTOMY  10/28/2011   Procedure: ROBOTIC ASSISTED LAPAROSCOPIC RADICAL PROSTATECTOMY;  Surgeon: Malka So, MD;  Location: [facility];  Service: Urology;  Laterality: N/A;   SHOULDER SURGERY     lt. arthroscopy   WISDOM TOOTH EXTRACTION  10-24-11   x2 extracted  Family History  Problem Relation Age of Onset   Diabetes Mother    Colon cancer Father 20       age dx'd 13- died 90 months later    Diabetes Sister        borderline   Diabetes Brother        borderline   ADD / ADHD Son    Colon polyps Neg Hx    Esophageal cancer Neg Hx    Rectal cancer Neg Hx    Stomach cancer Neg Hx     Social History   Tobacco Use   Smoking status: Never   Smokeless tobacco: Never  Vaping Use   Vaping Use: Never used  Substance Use Topics   Alcohol use: Not Currently   Drug use: Never    Home Medications Prior to Admission medications    Medication Sig Start Date End Date Taking? Authorizing Provider  atorvastatin (LIPITOR) 20 MG tablet TAKE 1 TABLET BY MOUTH EVERY DAY 11/21/20   Panosh, Standley Brooking, MD  fluticasone (FLONASE) 50 MCG/ACT nasal spray PLACE 2 SPRAYS IN EACH NOSTRIL EVERY DAY 10/30/20   Panosh, Standley Brooking, MD  hydrochlorothiazide (HYDRODIURIL) 25 MG tablet TAKE 1 TABLET BY MOUTH EVERY DAY 11/21/20   Panosh, Standley Brooking, MD  metFORMIN (GLUCOPHAGE) 500 MG tablet TAKE 2 TABLETS BY MOUTH EVERY MORNING AND TAKE 2 TABLETS EVERY EVENING 10/30/20   Panosh, Standley Brooking, MD  nirmatrelvir/ritonavir EUA (PAXLOVID) TABS Take 3 tablets by mouth twice daily for 5 days Patient not taking: Reported on 10/10/2020 05/18/20   Burnis Medin, MD  telmisartan (MICARDIS) 80 MG tablet TAKE 1 TABLET BY MOUTH EVERY DAY 11/21/20   Panosh, Standley Brooking, MD    Allergies    Quinapril hcl  Review of Systems   Review of Systems  Constitutional:  Negative for fever.  Respiratory:  Negative for cough and shortness of breath.   Cardiovascular:  Negative for chest pain.  Gastrointestinal:  Positive for abdominal pain and nausea. Negative for anorexia, constipation, diarrhea and vomiting.  Genitourinary:  Negative for dysuria and hematuria.  All other systems reviewed and are negative.  Physical Exam Updated Vital Signs BP (!) 189/91 (BP Location: Right Arm)    Pulse 72    Temp 97.7 F (36.5 C) (Oral)    Resp 16    Ht 5\' 5"  (1.651 m)    Wt 98.9 kg    SpO2 100%    BMI 36.28 kg/m   Physical Exam Vitals and nursing note reviewed.  Constitutional:      General: He is not in acute distress.    Appearance: He is well-developed.  HENT:     Head: Normocephalic and atraumatic.     Mouth/Throat:     Mouth: Mucous membranes are moist.  Eyes:     Conjunctiva/sclera: Conjunctivae normal.     Pupils: Pupils are equal, round, and reactive to light.  Cardiovascular:     Rate and Rhythm: Normal rate and regular rhythm.     Pulses: Normal pulses.     Heart sounds: No  murmur heard. Pulmonary:     Effort: Pulmonary effort is normal. No respiratory distress.     Breath sounds: Normal breath sounds. No wheezing or rales.  Abdominal:     General: There is no distension.     Palpations: Abdomen is soft.     Tenderness: There is abdominal tenderness in the right lower quadrant. There is right CVA tenderness. There is no guarding or rebound.  Hernia: No hernia is present.  Musculoskeletal:        General: No tenderness. Normal range of motion.     Cervical back: Normal range of motion and neck supple.     Right lower leg: No edema.     Left lower leg: No edema.  Skin:    General: Skin is warm and dry.     Findings: No erythema or rash.  Neurological:     Mental Status: He is alert and oriented to person, place, and time. Mental status is at baseline.  Psychiatric:        Mood and Affect: Mood normal.        Behavior: Behavior normal.    ED Results / Procedures / Treatments   Labs (all labs ordered are listed, but only abnormal results are displayed) Labs Reviewed - No data to display  EKG None  Radiology No results found.  Procedures Procedures   Medications Ordered in ED Medications  morphine 4 MG/ML injection 4 mg (has no administration in time range)  ondansetron (ZOFRAN) injection 4 mg (has no administration in time range)    ED Course  I have reviewed the triage vital signs and the nursing notes.  Pertinent labs & imaging results that were available during my care of the patient were reviewed by me and considered in my medical decision making (see chart for details).    MDM Rules/Calculators/A&P                         Pt is a 70 y/o male with past medical hx of kidney stone s/p lithotrispy presenting with right sided abd pain.  Pt is stable and in NAD on exam.  VS are reassuring except for hypertension.  Pt denies infectious sx and low suspicion for PNA, PE, cholecystis or pyelonephritis.  High suspicion for kidney stone.   Lower suspicion for AAA or dissection or appendicitis or diverticulitis. Pt given pain control and labs and imaging are pending.     Final Clinical Impression(s) / ED Diagnoses Final diagnoses:  None    Rx / DC Orders ED Discharge Orders     None        Blanchie Dessert, MD 01/15/21 [id number]

## 2021-01-10 NOTE — Discharge Instructions (Signed)

## 2021-01-10 NOTE — ED Provider Notes (Signed)
Blood pressure (!) 189/91, pulse 72, temperature 97.7 F (36.5 C), temperature source Oral, resp. rate 16, height 5\' 5"  (1.651 m), weight 98.9 kg, SpO2 100 %.  Assuming care from Dr. Maryan Rued.  In short, Daniel Randall. is a 70 y.o. male with a chief complaint of Abdominal Pain .  Refer to the original H&P for additional details.  The current plan of care is to follow up on labs and imaging. Patient with right flank pain, ureteral stone suspected.    Patient CT shows an obstructing stone near the right UVJ.  Stone is measuring 3 to 4 mm.  This correlates with the site of patient's symptoms.  He also has a chronic sigmoid giant diverticulum versus contained perforation.  Radiology notes this is similar to 2018.  CBC reviewed with no acute findings.  Metabolic panel along with UA are pending.  Patient's pain is well controlled at this time.  UA shows moderate hemoglobin but no findings to suggest infection.  Patient's pain remains well controlled.  Plan for urology follow-up.  Sent pain medication to the pharmacy after [facility] drug database reviewed.     Margette Fast, MD 01/10/21 [phone]

## 2021-01-10 NOTE — ED Notes (Signed)
Pt verbalized understanding of all discharge instructions, prescriptions, and f/u care.

## 2021-01-18 DIAGNOSIS — N202 Calculus of kidney with calculus of ureter: Secondary | ICD-10-CM | POA: Diagnosis not present

## 2021-02-20 ENCOUNTER — Other Ambulatory Visit: Payer: Self-pay | Admitting: Internal Medicine

## 2021-04-23 ENCOUNTER — Other Ambulatory Visit: Payer: Self-pay | Admitting: Internal Medicine

## 2021-06-12 ENCOUNTER — Ambulatory Visit (INDEPENDENT_AMBULATORY_CARE_PROVIDER_SITE_OTHER): Payer: Medicare Other | Admitting: Internal Medicine

## 2021-06-12 ENCOUNTER — Encounter: Payer: Self-pay | Admitting: Internal Medicine

## 2021-06-12 VITALS — BP 120/80 | HR 82 | Temp 98.1°F | Ht 65.0 in | Wt 209.0 lb

## 2021-06-12 DIAGNOSIS — Z6834 Body mass index (BMI) 34.0-34.9, adult: Secondary | ICD-10-CM

## 2021-06-12 DIAGNOSIS — Z8546 Personal history of malignant neoplasm of prostate: Secondary | ICD-10-CM | POA: Diagnosis not present

## 2021-06-12 DIAGNOSIS — Z0000 Encounter for general adult medical examination without abnormal findings: Secondary | ICD-10-CM | POA: Diagnosis not present

## 2021-06-12 DIAGNOSIS — E1165 Type 2 diabetes mellitus with hyperglycemia: Secondary | ICD-10-CM | POA: Diagnosis not present

## 2021-06-12 DIAGNOSIS — E785 Hyperlipidemia, unspecified: Secondary | ICD-10-CM

## 2021-06-12 DIAGNOSIS — I10 Essential (primary) hypertension: Secondary | ICD-10-CM | POA: Diagnosis not present

## 2021-06-12 DIAGNOSIS — Z79899 Other long term (current) drug therapy: Secondary | ICD-10-CM | POA: Diagnosis not present

## 2021-06-12 LAB — BASIC METABOLIC PANEL
BUN: 16 mg/dL (ref 6–23)
CO2: 30 mEq/L (ref 19–32)
Calcium: 9.6 mg/dL (ref 8.4–10.5)
Chloride: 97 mEq/L (ref 96–112)
Creatinine, Ser: 1.1 mg/dL (ref 0.40–1.50)
GFR: 67.53 mL/min (ref >60.00)
Glucose, Bld: 106 mg/dL — ABNORMAL HIGH (ref 70–99)
Potassium: 4.5 mEq/L (ref 3.5–5.1)
Sodium: 138 mEq/L (ref 135–145)

## 2021-06-12 LAB — CBC WITH DIFFERENTIAL/PLATELET
Basophils Absolute: 0.1 10*3/uL (ref 0.0–0.1)
Basophils Relative: 0.8 % (ref 0.0–3.0)
Eosinophils Absolute: 0.4 10*3/uL (ref 0.0–0.7)
Eosinophils Relative: 4.2 % (ref 0.0–5.0)
HCT: 41.1 % (ref 39.0–52.0)
Hemoglobin: 13.5 g/dL (ref 13.0–17.0)
Lymphocytes Relative: 20.8 % (ref 12.0–46.0)
Lymphs Abs: 1.8 10*3/uL (ref 0.7–4.0)
MCHC: 32.9 g/dL (ref 30.0–36.0)
MCV: 84 fl (ref 78.0–100.0)
Monocytes Absolute: 0.8 10*3/uL (ref 0.1–1.0)
Monocytes Relative: 9.5 % (ref 3.0–12.0)
Neutro Abs: 5.6 10*3/uL (ref 1.4–7.7)
Neutrophils Relative %: 64.7 % (ref 43.0–77.0)
Platelets: 315 10*3/uL (ref 150.0–400.0)
RBC: 4.9 Mil/uL (ref 4.22–5.81)
RDW: 14.4 % (ref 11.5–15.5)
WBC: 8.7 10*3/uL (ref 4.0–10.5)

## 2021-06-12 LAB — LIPID PANEL
Cholesterol: 137 mg/dL (ref 0–200)
HDL: 41 mg/dL (ref >39.00)
LDL Cholesterol: 71 mg/dL (ref 0–99)
NonHDL: 96.04
Total CHOL/HDL Ratio: 3
Triglycerides: 125 mg/dL (ref 0.0–149.0)
VLDL: 25 mg/dL (ref 0.0–40.0)

## 2021-06-12 LAB — MICROALBUMIN / CREATININE URINE RATIO
Creatinine,U: 186.9 mg/dL
Microalb Creat Ratio: 1.2 mg/g (ref 0.0–30.0)
Microalb, Ur: 2.3 mg/dL — ABNORMAL HIGH (ref 0.0–1.9)

## 2021-06-12 LAB — HEPATIC FUNCTION PANEL
ALT: 15 U/L (ref 0–53)
AST: 16 U/L (ref 0–37)
Albumin: 4.2 g/dL (ref 3.5–5.2)
Alkaline Phosphatase: 64 U/L (ref 39–117)
Bilirubin, Direct: 0.1 mg/dL (ref 0.0–0.3)
Total Bilirubin: 0.5 mg/dL (ref 0.2–1.2)
Total Protein: 7.3 g/dL (ref 6.0–8.3)

## 2021-06-12 LAB — PSA: PSA: 0.03 ng/mL — ABNORMAL LOW (ref 0.10–4.00)

## 2021-06-12 LAB — HEMOGLOBIN A1C: Hgb A1c MFr Bld: 6.4 % (ref 4.6–6.5)

## 2021-06-12 NOTE — Patient Instructions (Signed)
Good to see you today  Labs today  Continue lifestyle intervention healthy eating and exercise . And healthy weight loss .   Plan rov in 6 months to check hg a1c level  or as indicated .

## 2021-06-12 NOTE — Progress Notes (Signed)
Chief Complaint  Patient presents with   Annual Exam    fasting    HPI: Patient  Daniel WESTRY Sr.  71 y.o. comes in today for Preventive Health Care visit  Now on medicare  doing well since retirement  BP no concerns about  medication telmis hctz  H:HLD on atorva no se  BG taking 2 bid metformin 500 nose noted  Had  covid last  years paxlovid helped no se  Sees  Dermatology  yearly . Sees urology on prn basis flare in  December , had fu December . Prn .  Health Maintenance  Topic Date Due   Pneumonia Vaccine 26+ Years old (66) 12/13/2021 (Originally 06/03/2017)   FOOT EXAM  12/13/2021 (Originally 03/25/2020)   COVID-19 Vaccine (3 - Pfizer risk series) 12/13/2021 (Originally 09/19/2019)   Hepatitis C Screening  12/13/2021 (Originally 11/29/1968)   Zoster Vaccines- Shingrix (2 of 2) 12/13/2021 (Originally 05/30/2020)   INFLUENZA VACCINE  08/21/2021   HEMOGLOBIN A1C  12/13/2021   OPHTHALMOLOGY EXAM  12/21/2021   TETANUS/TDAP  05/13/2022   HPV VACCINES  Aged Out   COLONOSCOPY (Pts 45-49yr Insurance coverage will need to be confirmed)  Discontinued   Health Maintenance Review LIFESTYLE:  Exercise:   couple miles  per day  gyme 4 d per week.  Tobacco/ETS: no Alcohol:  no Sugar beverages:  decreasing  Sleep: 9-10  Drug use: no HH of   2   no pets  Work:retired  dec 22   ROS:  REST of 12 system review negative except as per HPI no cv pulm neuro sx    Past Medical History:  Diagnosis Date   Allergy    Atypical mole 12/30/2011   Right Side of Back(severe)   Atypical mole 03/10/2018   Left Side Outer(mild) (free)   BCC (basal cell carcinoma of skin) 02/18/2019   Behind Right Ear(residual) (excision)   Cataract    left eye removed with lens implant    Chronic kidney disease    kidney stones    Complication of anesthesia    Pt unaware of issue- denies difficult intubation, denies PONV    History of kidney stones    History of renal stone    Hx of bacterial pneumonia     hosp  2001   Hx of diverticulitis of colon    hosp 2001   Hyperlipidemia    Hypertension    Nodular basal cell carcinoma (BCC) 01/04/2013   Behind Right Ear   Pre-diabetes    Pro[place] CA [place] 10-24-11   7'13- bx. of prostate + cancer-surgery planned   Prostate cancer ([place] 10/31/2011    Past Surgical History:  Procedure Laterality Date   COLONOSCOPY     EXTRACORPOREAL SHOCK WAVE LITHOTRIPSY Left 05/30/2016   Procedure: LEFT EXTRACORPOREAL SHOCK WAVE LITHOTRIPSY (ESWL);  Surgeon: MCleon Gustin MD;  Location: [facility];  Service: Urology;  Laterality: Left;   POLYPECTOMY     ROBOT ASSISTED LAPAROSCOPIC RADICAL PROSTATECTOMY  10/28/2011   Procedure: ROBOTIC ASSISTED LAPAROSCOPIC RADICAL PROSTATECTOMY;  Surgeon: JMalka So MD;  Location: [facility];  Service: Urology;  Laterality: N/A;   SHOULDER SURGERY     lt. arthroscopy   WISDOM TOOTH EXTRACTION  10-24-11   x2 extracted    Family History  Problem Relation Age of Onset   Diabetes Mother    Colon cancer Father 666      age dx'd 652 died 636 monthslater    Diabetes Sister  borderline   Diabetes Brother        borderline   ADD / ADHD Son    Colon polyps Neg Hx    Esophageal cancer Neg Hx    Rectal cancer Neg Hx    Stomach cancer Neg Hx     Social History   Socioeconomic History   Marital status: Married    Spouse name: Not on file   Number of children: Not on file   Years of education: Not on file   Highest education level: Not on file  Occupational History   Not on file  Tobacco Use   Smoking status: Never   Smokeless tobacco: Never  Vaping Use   Vaping Use: Never used  Substance and Sexual Activity   Alcohol use: Not Currently   Drug use: Never   Sexual activity: Not Currently  Other Topics Concern   Not on file  Social History Narrative   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    Accountant   2 cats and a dog   Social Determinants of Radio broadcast assistant Strain: Not on file  Food  Insecurity: Not on file  Transportation Needs: Not on file  Physical Activity: Not on file  Stress: Not on file  Social Connections: Not on file    Outpatient Medications Prior to Visit  Medication Sig Dispense Refill   atorvastatin (LIPITOR) 20 MG tablet TAKE 1 TABLET BY MOUTH EVERY DAY 90 tablet 1   fluticasone (FLONASE) 50 MCG/ACT nasal spray PLACE 2 SPRAYS IN EACH NOSTRIL EVERY DAY 48 g 2   hydrochlorothiazide (HYDRODIURIL) 25 MG tablet TAKE 1 TABLET BY MOUTH EVERY DAY 90 tablet 1   metFORMIN (GLUCOPHAGE) 500 MG tablet TAKE 2 TABLETS BY MOUTH EVERY MORNING AND TAKE 2 TABLETS EVERY EVENING 360 tablet 2   telmisartan (MICARDIS) 80 MG tablet TAKE 1 TABLET BY MOUTH EVERY DAY 90 tablet 1   nirmatrelvir/ritonavir EUA (PAXLOVID) TABS Take 3 tablets by mouth twice daily for 5 days (Patient not taking: Reported on 10/10/2020) 30 tablet 0   oxyCODONE-acetaminophen (PERCOCET/ROXICET) 5-325 MG tablet Take 1 tablet by mouth every 6 (six) hours as needed for severe pain. (Patient not taking: Reported on 06/12/2021) 15 tablet 0   senna-docusate (SENOKOT-S) 8.6-50 MG tablet Take 1 tablet by mouth at bedtime as needed for mild constipation. (Patient not taking: Reported on 06/12/2021) 30 tablet 0   Facility-Administered Medications Prior to Visit  Medication Dose Route Frequency Provider Last Rate Last Admin   0.9 %  sodium chloride infusion  500 mL Intravenous Once Armbruster, Carlota Raspberry, MD         EXAM:  BP 120/80 (BP Location: Left Arm, Patient Position: Sitting, Cuff Size: Normal)   Pulse 82   Temp 98.1 F (36.7 C) (Oral)   Ht '5\' 5"'$  (1.651 m)   Wt 209 lb (94.8 kg)   SpO2 99%   BMI 34.78 kg/m   Body mass index is 34.78 kg/m. Wt Readings from Last 3 Encounters:  06/12/21 209 lb (94.8 kg)  01/10/21 218 lb (98.9 kg)  10/10/20 221 lb 3.2 oz (100.3 kg)    Physical Exam: Vital signs reviewed [id number] is a well-developed well-nourished alert cooperative    who appearsr stated age in no acute  distress.  HEENT: normocephalic atraumatic , Eyes: PERRL EOM's full, conjunctiva clear, Nares: paten,t no deformity discharge or tenderness., Ears: no deformity EAC's clear TMs with normal landmarks.  NECK: supple without masses, thyromegaly  or bruits. CHEST/PULM:  Clear to auscultation and percussion breath sounds equal no wheeze , rales or rhonchi. No chest wall deformities or tenderness. CV: PMI is nondisplaced, S1 S2 no gallops, murmurs, rubs. Peripheral pulses are full without delay.No JVD .  ABDOMEN: Bowel sounds normal nontender  No guard or rebound, no hepato splenomegal no CVA tenderness.   Extremtities:  No clubbing cyanosis or edema, no acute joint swelling or redness no focal atrophy NEURO:  Oriented x3, cranial nerves 3-12 appear to be intact, no obvious focal weakness,gait within normal limits no abnormal reflexes or asymmetrical SKIN: No acute rashes normal turgor, color, no bruising or petechiae. Multiple sks  PSYCH: Oriented, good eye contact, no obvious depression anxiety, cognition and judgment appear normal. LN: no cervical axillary adenopathy Diabetic Foot Exam - Simple   Simple Foot Form  06/12/2021 10:10 AM  Visual Inspection No deformities, no ulcerations, no other skin breakdown bilaterally: Yes Sensation Testing Intact to touch and monofilament testing bilaterally: Yes Pulse Check Posterior Tibialis and Dorsalis pulse intact bilaterally: Yes Comments     Lab Results  Component Value Date   WBC 8.7 06/12/2021   HGB 13.5 06/12/2021   HCT 41.1 06/12/2021   PLT 315.0 06/12/2021   GLUCOSE 106 (H) 06/12/2021   CHOL 137 06/12/2021   TRIG 125.0 06/12/2021   HDL 41.00 06/12/2021   LDLCALC 71 06/12/2021   ALT 15 06/12/2021   AST 16 06/12/2021   NA 138 06/12/2021   K 4.5 06/12/2021   CL 97 06/12/2021   CREATININE 1.10 06/12/2021   BUN 16 06/12/2021   CO2 30 06/12/2021   TSH 3.63 03/26/2019   PSA 0.03 (L) 06/12/2021   HGBA1C 6.4 06/12/2021   MICROALBUR 2.3  (H) 06/12/2021    BP Readings from Last 3 Encounters:  06/12/21 120/80  01/10/21 (!) 156/86  10/10/20 136/80    Lab plan reviewed with patient   ASSESSMENT AND PLAN:  Discussed the following assessment and plan:    ICD-10-CM   1. Visit for preventive health examination  Z00.00     2. Type 2 diabetes mellitus with hyperglycemia, without long-term current use of insulin (HCC)  I50.38 Basic metabolic panel    CBC with Differential/Platelet    Hemoglobin A1c    Hepatic function panel    Lipid panel    Microalbumin / creatinine urine ratio    Microalbumin / creatinine urine ratio    Lipid panel    Hepatic function panel    Hemoglobin A1c    CBC with Differential/Platelet    Basic metabolic panel   at goal    3. Medication management  U82.800 Basic metabolic panel    CBC with Differential/Platelet    Hemoglobin A1c    Hepatic function panel    Lipid panel    Microalbumin / creatinine urine ratio    Microalbumin / creatinine urine ratio    Lipid panel    Hepatic function panel    Hemoglobin A1c    CBC with Differential/Platelet    Basic metabolic panel    4. Essential hypertension  L49 Basic metabolic panel    CBC with Differential/Platelet    Hemoglobin A1c    Hepatic function panel    Lipid panel    Microalbumin / creatinine urine ratio    Microalbumin / creatinine urine ratio    Lipid panel    Hepatic function panel    Hemoglobin A1c    CBC with Differential/Platelet    Basic metabolic panel  at goal     5. Hyperlipidemia, unspecified hyperlipidemia type  A35.5 Basic metabolic panel    CBC with Differential/Platelet    Hemoglobin A1c    Hepatic function panel    Lipid panel    Microalbumin / creatinine urine ratio    Microalbumin / creatinine urine ratio    Lipid panel    Hepatic function panel    Hemoglobin A1c    CBC with Differential/Platelet    Basic metabolic panel    6. Personal history of prostate cancer  D32.20 Basic metabolic panel    CBC  with Differential/Platelet    Hemoglobin A1c    Hepatic function panel    Lipid panel    PSA    Microalbumin / creatinine urine ratio    Microalbumin / creatinine urine ratio    PSA    Lipid panel    Hepatic function panel    Hemoglobin A1c    CBC with Differential/Platelet    Basic metabolic panel    7. BMI 34.0-34.9,adult  Z68.34     Continue weight loss ,  to help metabolic parameters    Utd on hcm   Return in 6 months (on 12/13/2021) for a1c and bmi.  Patient Care Team: Vadim Centola, Standley Brooking, MD as PCP - General McKenzie, Candee Furbish, MD as Consulting Physician (Urology) Warren Danes, PA-C as Physician Assistant (Dermatology) Lavonna Monarch, MD as Consulting Physician (Dermatology) Patient Instructions  Good to see you today  Labs today  Continue lifestyle intervention healthy eating and exercise . And healthy weight loss .   Plan rov in 6 months to check hg a1c level  or as indicated .  Standley Brooking. Lindia Garms [name]

## 2021-06-12 NOTE — Progress Notes (Signed)
Results stable   hg a1c is 6.4  good , cholesterol  panel has improved  continue same medications , and exercise weight loss.

## 2021-08-20 ENCOUNTER — Other Ambulatory Visit: Payer: Self-pay | Admitting: Internal Medicine

## 2021-09-28 ENCOUNTER — Telehealth: Payer: Self-pay | Admitting: *Deleted

## 2021-09-28 NOTE — Patient Outreach (Signed)
  Care Coordination   09/28/2021 Name: Daniel SIDOR Sr. MRN: [id number] DOB: 10-Oct-1950   Care Coordination Outreach Attempts:  An unsuccessful telephone outreach was attempted today to offer the patient information about available care coordination services as a benefit of their health plan.   Follow Up Plan:  Additional outreach attempts will be made to offer the patient care coordination information and services.   Encounter Outcome:  No Answer  Care Coordination Interventions Activated:  No   Care Coordination Interventions:  No, not indicated    Raina Mina, RN Care Management Coordinator [place] Office [phone]

## 2021-10-09 ENCOUNTER — Ambulatory Visit: Payer: BC Managed Care – PPO | Admitting: Dermatology

## 2021-10-19 ENCOUNTER — Telehealth: Payer: Self-pay | Admitting: *Deleted

## 2021-10-19 NOTE — Patient Outreach (Signed)
  Care Coordination   10/19/2021 Name: Daniel BERGFELD Sr. MRN: [id number] DOB: 06-13-1950   Care Coordination Outreach Attempts:  A second unsuccessful outreach was attempted today to offer the patient with information about available care coordination services as a benefit of their health plan.     Follow Up Plan:  Additional outreach attempts will be made to offer the patient care coordination information and services.   Encounter Outcome:  No Answer  Care Coordination Interventions Activated:  No   Care Coordination Interventions:  No, not indicated     Raina Mina, RN Care Management Coordinator [place] Office [phone]

## 2021-10-31 ENCOUNTER — Telehealth: Payer: Self-pay

## 2021-10-31 NOTE — Patient Outreach (Signed)
  Care Coordination   10/31/2021 Name: Daniel Randall Sr. MRN: [id number] DOB: [birth year]   Care Coordination Outreach Attempts:  A third unsuccessful outreach was attempted today to offer the patient with information about available care coordination services as a benefit of their health plan.   Follow Up Plan:  No further outreach attempts will be made at this time. We have been unable to contact the patient to offer or enroll patient in care coordination services  Encounter Outcome:  No Answer  Care Coordination Interventions Activated:  No   Care Coordination Interventions:  No, not indicated    Peter Garter RN, BSN,CCM, [place] [facility] [phone]

## 2021-11-14 DIAGNOSIS — E119 Type 2 diabetes mellitus without complications: Secondary | ICD-10-CM | POA: Diagnosis not present

## 2021-11-14 LAB — HM DIABETES EYE EXAM

## 2021-12-05 ENCOUNTER — Encounter: Payer: Self-pay | Admitting: Internal Medicine

## 2021-12-17 NOTE — Progress Notes (Unsigned)
ACUTE VISIT No chief complaint on file.  HPI: [name] is a 71 y.o. male, who is here today complaining of *** HPI  Review of Systems See other pertinent positives and negatives in HPI.  Current Outpatient Medications on File Prior to Visit  Medication Sig Dispense Refill   atorvastatin (LIPITOR) 20 MG tablet TAKE 1 TABLET BY MOUTH EVERY DAY 90 tablet 1   fluticasone (FLONASE) 50 MCG/ACT nasal spray PLACE 2 SPRAYS IN EACH NOSTRIL EVERY DAY 48 g 2   hydrochlorothiazide (HYDRODIURIL) 25 MG tablet TAKE 1 TABLET BY MOUTH EVERY DAY 90 tablet 1   metFORMIN (GLUCOPHAGE) 500 MG tablet TAKE 2 TABLETS BY MOUTH EVERY MORNING AND TAKE 2 TABLETS EVERY EVENING 360 tablet 2   telmisartan (MICARDIS) 80 MG tablet TAKE 1 TABLET BY MOUTH EVERY DAY 90 tablet 1   Current Facility-Administered Medications on File Prior to Visit  Medication Dose Route Frequency Provider Last Rate Last Admin   0.9 %  sodium chloride infusion  500 mL Intravenous Once Armbruster, Carlota Raspberry, MD        Past Medical History:  Diagnosis Date   Allergy    Atypical mole 12/30/2011   Right Side of Back(severe)   Atypical mole 03/10/2018   Left Side Outer(mild) (free)   BCC (basal cell carcinoma of skin) 02/18/2019   Behind Right Ear(residual) (excision)   Cataract    left eye removed with lens implant    Chronic kidney disease    kidney stones    Complication of anesthesia    Pt unaware of issue- denies difficult intubation, denies PONV    History of kidney stones    History of renal stone    Hx of bacterial pneumonia    hosp  2001   Hx of diverticulitis of colon    hosp 2001   Hyperlipidemia    Hypertension    Nodular basal cell carcinoma (BCC) 01/04/2013   Behind Right Ear   Pre-diabetes    Prostate CA ([place]) 10-24-11   7'13- bx. of prostate + cancer-surgery planned   Prostate cancer ([place]) 10/31/2011   Allergies  Allergen Reactions   Quinapril Hcl     REACTION: cough    Social History    Socioeconomic History   Marital status: Married    Spouse name: Not on file   Number of children: Not on file   Years of education: Not on file   Highest education level: Not on file  Occupational History   Not on file  Tobacco Use   Smoking status: Never   Smokeless tobacco: Never  Vaping Use   Vaping Use: Never used  Substance and Sexual Activity   Alcohol use: Not Currently   Drug use: Never   Sexual activity: Not Currently  Other Topics Concern   Not on file  Social History Narrative   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    Accountant   2 cats and a dog   Social Determinants of Radio broadcast assistant Strain: Not on file  Food Insecurity: Not on file  Transportation Needs: Not on file  Physical Activity: Not on file  Stress: Not on file  Social Connections: Not on file    There were no vitals filed for this visit. There is no height or weight on file to calculate BMI.  Physical Exam  ASSESSMENT AND PLAN: There are no diagnoses linked to this encounter.  No follow-ups on file.  Cooper Moroney G. Martinique, MD  [facility]. [place] office.  Discharge Instructions   None

## 2021-12-18 ENCOUNTER — Encounter: Payer: Self-pay | Admitting: Family Medicine

## 2021-12-18 ENCOUNTER — Ambulatory Visit (INDEPENDENT_AMBULATORY_CARE_PROVIDER_SITE_OTHER): Payer: Medicare Other | Admitting: Family Medicine

## 2021-12-18 VITALS — BP 128/80 | HR 90 | Temp 98.1°F | Ht 65.0 in | Wt 214.1 lb

## 2021-12-18 DIAGNOSIS — H8110 Benign paroxysmal vertigo, unspecified ear: Secondary | ICD-10-CM | POA: Diagnosis not present

## 2021-12-18 DIAGNOSIS — R42 Dizziness and giddiness: Secondary | ICD-10-CM

## 2021-12-18 MED ORDER — MECLIZINE HCL 25 MG PO TABS: 25.0000 mg | ORAL_TABLET | Freq: Every evening | ORAL | 0 refills | Status: DC | PRN

## 2021-12-18 NOTE — Patient Instructions (Addendum)
A few things to remember from today's visit:  Dizziness  Benign paroxysmal positional vertigo, unspecified laterality - Plan: meclizine (ANTIVERT) 25 MG tablet Meclizine 25 mg at bedtime for 5 days.  Dizziness is a perception of movement, it is sometimes difficult to describe and can be  caused by different problems, most benign but others can be life threaten.  Vertigo is the most common cause of dizziness, usually related with inner ear and can be associated with nausea, vomiting, and unbalance sensation. It can be complicated by falls due to lose of balance; so fall precautions are very important.  Most of the time dizziness is benign, usually intermittent, last a few seconds at the time and aggravated by certain positions. It usually resolves in a few weeks without residual effect but it could be recurrent.  Sometimes blood work is ordered to evaluate for other possible causes.  Dizziness can also be caused by certain medications, dehydration, migraines, and strokes.  Medication prescribed for vertigo, Meclizine, causes drowsiness/sleepiness, so frequently I recommended taking it at bedtime. I also recommend what we called vestibular exercise, sometimes can be done at home (Modified [place]ont maneuvers) other times I refer patients to vestibular rehabilitation.  Seek immediate medical attention if: New severe headache, dobble vision, fever (100 F or more), associated numbness/tingling, focal weakness, persistent vomiting, not able to walk, or sudden worsening symptoms.  Please be sure medication list is accurate. If a new problem present, please set up appointment sooner than planned today.

## 2022-01-10 ENCOUNTER — Ambulatory Visit (INDEPENDENT_AMBULATORY_CARE_PROVIDER_SITE_OTHER): Payer: Medicare Other | Admitting: Family

## 2022-01-10 ENCOUNTER — Encounter: Payer: Self-pay | Admitting: Family

## 2022-01-10 VITALS — BP 142/76 | HR 90 | Temp 97.7°F | Wt 216.0 lb

## 2022-01-10 DIAGNOSIS — R42 Dizziness and giddiness: Secondary | ICD-10-CM

## 2022-01-10 DIAGNOSIS — I10 Essential (primary) hypertension: Secondary | ICD-10-CM

## 2022-01-10 NOTE — Progress Notes (Signed)
Acute Office Visit  Subjective:     Patient ID: Daniel Randall., male    DOB: 07-16-1950, 71 y.o.   MRN: [id number]  Chief Complaint  Patient presents with  . Follow-up    Pt states dizziness has resolved. Took meclizine first 5 days & then discontinued    HPI Patient is in today for a recheck after being seen by Dr. Martinique for dizziness. He reports being prescribed Meclizine that did not help much. However, he was provided with exercises that helped and completely resolved the dizziness. He is doing well now. No concerns.   Review of Systems  All other systems reviewed and are negative. Past Medical History:  Diagnosis Date  . Allergy   . Atypical mole 12/30/2011   Right Side of Back(severe)  . Atypical mole 03/10/2018   Left Side Outer(mild) (free)  . BCC (basal cell carcinoma of skin) 02/18/2019   Behind Right Ear(residual) (excision)  . Cataract    left eye removed with lens implant   . Chronic kidney disease    kidney stones   . Complication of anesthesia    Pt unaware of issue- denies difficult intubation, denies PONV   . History of kidney stones   . History of renal stone   . Hx of bacterial pneumonia    hosp  2001  . Hx of diverticulitis of colon    hosp 2001  . Hyperlipidemia   . Hypertension   . Nodular basal cell carcinoma (BCC) 01/04/2013   Behind Right Ear  . Pre-diabetes   . Prostate CA ([place]) 10-24-11   7'13- bx. of prostate + cancer-surgery planned  . Prostate cancer ([place]) 10/31/2011    Social History   Socioeconomic History  . Marital status: Married    Spouse name: Not on file  . Number of children: Not on file  . Years of education: Not on file  . Highest education level: Not on file  Occupational History  . Not on file  Tobacco Use  . Smoking status: Never  . Smokeless tobacco: Never  Vaping Use  . Vaping Use: Never used  Substance and Sexual Activity  . Alcohol use: Not Currently  . Drug use: Never  . Sexual activity: Not  Currently  Other Topics Concern  . 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    Accountant   2 cats and a dog   Social Determinants of Radio broadcast assistant Strain: Not on file  Food Insecurity: Not on file  Transportation Needs: Not on file  Physical Activity: Not on file  Stress: Not on file  Social Connections: Not on file  Intimate Partner Violence: Not on file    Past Surgical History:  Procedure Laterality Date  . COLONOSCOPY    . EXTRACORPOREAL SHOCK WAVE LITHOTRIPSY Left 05/30/2016   Procedure: LEFT EXTRACORPOREAL SHOCK WAVE LITHOTRIPSY (ESWL);  Surgeon: Cleon Gustin, MD;  Location: [facility];  Service: Urology;  Laterality: Left;  . POLYPECTOMY    . ROBOT ASSISTED LAPAROSCOPIC RADICAL PROSTATECTOMY  10/28/2011   Procedure: ROBOTIC ASSISTED LAPAROSCOPIC RADICAL PROSTATECTOMY;  Surgeon: Malka So, MD;  Location: [facility];  Service: Urology;  Laterality: N/A;  . SHOULDER SURGERY     lt. arthroscopy  . WISDOM TOOTH EXTRACTION  10-24-11   x2 extracted    Family History  Problem Relation Age of Onset  . Diabetes Mother   . Colon  cancer Father 52       age dx'd 98- died 63 months later   . Diabetes Sister        borderline  . Diabetes Brother        borderline  . ADD / ADHD Son   . Colon polyps Neg Hx   . Esophageal cancer Neg Hx   . Rectal cancer Neg Hx   . Stomach cancer Neg Hx     Allergies  Allergen Reactions  . Quinapril Hcl     REACTION: cough    Current Outpatient Medications on File Prior to Visit  Medication Sig Dispense Refill  . atorvastatin (LIPITOR) 20 MG tablet TAKE 1 TABLET BY MOUTH EVERY DAY 90 tablet 1  . fluticasone (FLONASE) 50 MCG/ACT nasal spray PLACE 2 SPRAYS IN EACH NOSTRIL EVERY DAY 48 g 2  . hydrochlorothiazide (HYDRODIURIL) 25 MG tablet TAKE 1 TABLET BY MOUTH EVERY DAY 90 tablet 1  . metFORMIN (GLUCOPHAGE) 500 MG tablet TAKE 2 TABLETS BY MOUTH EVERY MORNING AND TAKE 2 TABLETS EVERY EVENING  360 tablet 2  . telmisartan (MICARDIS) 80 MG tablet TAKE 1 TABLET BY MOUTH EVERY DAY 90 tablet 1  . meclizine (ANTIVERT) 25 MG tablet Take 1 tablet (25 mg total) by mouth at bedtime as needed for dizziness. (Patient not taking: Reported on 01/10/2022) 20 tablet 0   Current Facility-Administered Medications on File Prior to Visit  Medication Dose Route Frequency Provider Last Rate Last Admin  . 0.9 %  sodium chloride infusion  500 mL Intravenous Once Armbruster, Carlota Raspberry, MD        BP (!) 142/76   Pulse 90   Temp 97.7 F (36.5 C) (Oral)   Wt 216 lb (98 kg)   SpO2 97%   BMI 35.94 kg/m chart      Objective:    BP (!) 142/76   Pulse 90   Temp 97.7 F (36.5 C) (Oral)   Wt 216 lb (98 kg)   SpO2 97%   BMI 35.94 kg/m    Physical Exam Vitals reviewed.  Constitutional:      Appearance: Normal appearance. He is obese.  Cardiovascular:     Rate and Rhythm: Normal rate and regular rhythm.     Pulses: Normal pulses.     Heart sounds: Normal heart sounds.  Pulmonary:     Effort: Pulmonary effort is normal.     Breath sounds: Normal breath sounds.  Skin:    General: Skin is warm and dry.  Neurological:     General: No focal deficit present.     Mental Status: He is alert and oriented to person, place, and time.     Motor: No weakness.     Coordination: Coordination normal.     Gait: Gait normal.     Deep Tendon Reflexes: Reflexes normal.  Psychiatric:        Mood and Affect: Mood normal.        Behavior: Behavior normal.   No results found for any visits on 01/10/22.      Assessment & Plan:   Problem List Items Addressed This Visit     Essential hypertension   Other Visit Diagnoses     Dizziness    -  Primary       Call the office with any questions or concerns. Recheck as scheduled and sooner as needed.    No follow-ups on file.  Kennyth Arnold, FNP

## 2022-01-22 ENCOUNTER — Other Ambulatory Visit: Payer: Self-pay | Admitting: Internal Medicine

## 2022-01-22 ENCOUNTER — Ambulatory Visit: Payer: Medicare Other | Admitting: Internal Medicine

## 2022-01-22 ENCOUNTER — Telehealth: Payer: Self-pay | Admitting: Internal Medicine

## 2022-01-22 NOTE — Telephone Encounter (Signed)
Left message for patient to call back and schedule Medicare Annual Wellness Visit (AWV) either virtually or in office. Left  my [name] number [phone]   awvi 01/21/22 per [facility] please schedule with Nurse Health Adviser   45 min for awv-i and in office appointments 30 min for awv-s  phone/virtual appointments

## 2022-01-30 DIAGNOSIS — K080 Exfoliation of teeth due to systemic causes: Secondary | ICD-10-CM | POA: Diagnosis not present

## 2022-02-19 ENCOUNTER — Other Ambulatory Visit: Payer: Self-pay | Admitting: Internal Medicine

## 2022-06-24 NOTE — Progress Notes (Unsigned)
No chief complaint on file.   HPI: Patient  Daniel PURPLE Sr.  72 y.o. comes in today for Preventive Health Care visit   Health Maintenance  Topic Date Due   Hepatitis C Screening  Never done   COVID-19 Vaccine (3 - Pfizer risk series) 09/19/2019   FOOT EXAM  03/25/2020   Zoster Vaccines- Shingrix (2 of 2) 05/30/2020   Pneumonia Vaccine 26+ Years old (3 of 3 - PPSV23 or PCV20) 06/03/2021   HEMOGLOBIN A1C  12/13/2021   DTaP/Tdap/Td (3 - Td or Tdap) 05/13/2022   Diabetic kidney evaluation - eGFR measurement  06/13/2022   Diabetic kidney evaluation - Urine ACR  06/13/2022   INFLUENZA VACCINE  08/22/2022   OPHTHALMOLOGY EXAM  11/15/2022   HPV VACCINES  Aged Out   Colonoscopy  Discontinued   Health Maintenance Review LIFESTYLE:  Exercise:   Tobacco/ETS: Alcohol:  Sugar beverages: Sleep: Drug use: no HH of  Work:    ROS:  GEN/ HEENT: No fever, significant weight changes sweats headaches vision problems hearing changes, CV/ PULM; No chest pain shortness of breath cough, syncope,edema  change in exercise tolerance. GI /GU: No adominal pain, vomiting, change in bowel habits. No blood in the stool. No significant GU symptoms. SKIN/HEME: ,no acute skin rashes suspicious lesions or bleeding. No lymphadenopathy, nodules, masses.  NEURO/ PSYCH:  No neurologic signs such as weakness numbness. No depression anxiety. IMM/ Allergy: No unusual infections.  Allergy .   REST of 12 system review negative except as per HPI   Past Medical History:  Diagnosis Date   Allergy    Atypical mole 12/30/2011   Right Side of Back(severe)   Atypical mole 03/10/2018   Left Side Outer(mild) (free)   BCC (basal cell carcinoma of skin) 02/18/2019   Behind Right Ear(residual) (excision)   Cataract    left eye removed with lens implant    Chronic kidney disease    kidney stones    Complication of anesthesia    Pt unaware of issue- denies difficult intubation, denies PONV    History of kidney  stones    History of renal stone    Hx of bacterial pneumonia    hosp  2001   Hx of diverticulitis of colon    hosp 2001   Hyperlipidemia    Hypertension    Nodular basal cell carcinoma (BCC) 01/04/2013   Behind Right Ear   Pre-diabetes    Prostate CA ([facility]) 10-24-11   7'13- bx. of prostate + cancer-surgery planned   Prostate cancer ([facility]) 10/31/2011    Past Surgical History:  Procedure Laterality Date   COLONOSCOPY     EXTRACORPOREAL SHOCK WAVE LITHOTRIPSY Left 05/30/2016   Procedure: LEFT EXTRACORPOREAL SHOCK WAVE LITHOTRIPSY (ESWL);  Surgeon: Malen Gauze, MD;  [facility];  Service: Urology;  Laterality: Left;   POLYPECTOMY     ROBOT ASSISTED LAPAROSCOPIC RADICAL PROSTATECTOMY  10/28/2011   Procedure: ROBOTIC ASSISTED LAPAROSCOPIC RADICAL PROSTATECTOMY;  Surgeon: Anner Crete, MD;  [facility];  Service: Urology;  Laterality: N/A;   SHOULDER SURGERY     lt. arthroscopy   WISDOM TOOTH EXTRACTION  10-24-11   x2 extracted    Family History  Problem Relation Age of Onset   Diabetes Mother    Colon cancer Father 29       age dx'd 61- died 6 months later    Diabetes Sister        borderline   Diabetes Brother  borderline   ADD / ADHD Son    Colon polyps Neg Hx    Esophageal cancer Neg Hx    Rectal cancer Neg Hx    Stomach cancer Neg Hx     Social History   Socioeconomic History   Marital status: Married    Spouse name: Not on file   Number of children: Not on file   Years of education: Not on file   Highest education level: Not on file  Occupational History   Not on file  Tobacco Use   Smoking status: Never   Smokeless tobacco: Never  Vaping Use   Vaping Use: Never used  Substance and Sexual Activity   Alcohol use: Not Currently   Drug use: Never   Sexual activity: Not Currently  Other Topics Concern   Not on file  Social History Narrative   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    Accountant   2 cats and a dog   Social Determinants  of Corporate investment banker Strain: Not on file  Food Insecurity: Not on file  Transportation Needs: Not on file  Physical Activity: Not on file  Stress: Not on file  Social Connections: Not on file    Outpatient Medications Prior to Visit  Medication Sig Dispense Refill   atorvastatin (LIPITOR) 20 MG tablet TAKE 1 TABLET BY MOUTH EVERY DAY 90 tablet 1   fluticasone (FLONASE) 50 MCG/ACT nasal spray PLACE 2 SPRAYS IN EACH NOSTRIL EVERY DAY 48 g 2   hydrochlorothiazide (HYDRODIURIL) 25 MG tablet TAKE 1 TABLET BY MOUTH EVERY DAY 90 tablet 1   meclizine (ANTIVERT) 25 MG tablet Take 1 tablet (25 mg total) by mouth at bedtime as needed for dizziness. (Patient not taking: Reported on 01/10/2022) 20 tablet 0   metFORMIN (GLUCOPHAGE) 500 MG tablet TAKE 2 TABLETS BY MOUTH EVERY MORNING and TAKE 2 TABLETS EVERY EVENING 360 tablet 2   telmisartan (MICARDIS) 80 MG tablet TAKE 1 TABLET BY MOUTH EVERY DAY 90 tablet 1   Facility-Administered Medications Prior to Visit  Medication Dose Route Frequency Provider Last Rate Last Admin   0.9 %  sodium chloride infusion  500 mL Intravenous Once Armbruster, Willaim Rayas, MD         EXAM:  There were no vitals taken for this visit.  There is no height or weight on file to calculate BMI. Wt Readings from Last 3 Encounters:  01/10/22 216 lb (98 kg)  12/18/21 214 lb 2 oz (97.1 kg)  06/12/21 209 lb (94.8 kg)    Physical Exam: Vital signs reviewed ZOX:WRUE is a well-developed well-nourished alert cooperative    who appearsr stated age in no acute distress.  HEENT: normocephalic atraumatic , Eyes: PERRL EOM's full, conjunctiva clear, Nares: paten,t no deformity discharge or tenderness., Ears: no deformity EAC's clear TMs with normal landmarks. Mouth: clear OP, no lesions, edema.  Moist mucous membranes. Dentition in adequate repair. NECK: supple without masses, thyromegaly or bruits. CHEST/PULM:  Clear to auscultation and percussion breath sounds equal no  wheeze , rales or rhonchi. No chest wall deformities or tenderness. Breast: normal by inspection . No dimpling, discharge, masses, tenderness or discharge . CV: PMI is nondisplaced, S1 S2 no gallops, murmurs, rubs. Peripheral pulses are full without delay.No JVD .  ABDOMEN: Bowel sounds normal nontender  No guard or rebound, no hepato splenomegal no CVA tenderness.  No hernia. Extremtities:  No clubbing cyanosis or edema, no acute joint swelling or  redness no focal atrophy NEURO:  Oriented x3, cranial nerves 3-12 appear to be intact, no obvious focal weakness,gait within normal limits no abnormal reflexes or asymmetrical SKIN: No acute rashes normal turgor, color, no bruising or petechiae. PSYCH: Oriented, good eye contact, no obvious depression anxiety, cognition and judgment appear normal. LN: no cervical axillary inguinal adenopathy  Lab Results  Component Value Date   WBC 8.7 06/12/2021   HGB 13.5 06/12/2021   HCT 41.1 06/12/2021   PLT 315.0 06/12/2021   GLUCOSE 106 (H) 06/12/2021   CHOL 137 06/12/2021   TRIG 125.0 06/12/2021   HDL 41.00 06/12/2021   LDLCALC 71 06/12/2021   ALT 15 06/12/2021   AST 16 06/12/2021   NA 138 06/12/2021   K 4.5 06/12/2021   CL 97 06/12/2021   CREATININE 1.10 06/12/2021   BUN 16 06/12/2021   CO2 30 06/12/2021   TSH 3.63 03/26/2019   PSA 0.03 (L) 06/12/2021   HGBA1C 6.4 06/12/2021   MICROALBUR 2.3 (H) 06/12/2021    BP Readings from Last 3 Encounters:  01/10/22 (!) 142/76  12/18/21 128/80  06/12/21 120/80    Labplan  reviewed with patient   ASSESSMENT AND PLAN:  Discussed the following assessment and plan:    ICD-10-CM   1. Essential hypertension  I10     2. Visit for preventive health examination  Z00.00     3. Pre-diabetes  R73.03     4. Medication management  Z79.899     5. Fasting hyperglycemia  R73.01     6. Prostate cancer ([facility])  C61      No follow-ups on file.  Patient Care Team: Johnpatrick Jenny, Neta Mends, MD as PCP -  General McKenzie, Mardene Celeste, MD as Consulting Physician (Urology) Glyn Ade, PA-C as Physician Assistant (Dermatology) Janalyn Harder, MD (Inactive) as Consulting Physician (Dermatology) There are no Patient Instructions on file for this visit.  Neta Mends. Darwin Rothlisberger [name]

## 2022-06-25 ENCOUNTER — Ambulatory Visit (INDEPENDENT_AMBULATORY_CARE_PROVIDER_SITE_OTHER): Payer: Medicare Other | Admitting: Internal Medicine

## 2022-06-25 ENCOUNTER — Encounter: Payer: Self-pay | Admitting: Internal Medicine

## 2022-06-25 VITALS — BP 136/70 | HR 86 | Temp 98.2°F | Ht 65.0 in | Wt 216.0 lb

## 2022-06-25 DIAGNOSIS — I10 Essential (primary) hypertension: Secondary | ICD-10-CM | POA: Diagnosis not present

## 2022-06-25 DIAGNOSIS — Z79899 Other long term (current) drug therapy: Secondary | ICD-10-CM

## 2022-06-25 DIAGNOSIS — E785 Hyperlipidemia, unspecified: Secondary | ICD-10-CM

## 2022-06-25 DIAGNOSIS — Z7984 Long term (current) use of oral hypoglycemic drugs: Secondary | ICD-10-CM

## 2022-06-25 DIAGNOSIS — Z0000 Encounter for general adult medical examination without abnormal findings: Secondary | ICD-10-CM | POA: Diagnosis not present

## 2022-06-25 DIAGNOSIS — E1165 Type 2 diabetes mellitus with hyperglycemia: Secondary | ICD-10-CM | POA: Diagnosis not present

## 2022-06-25 DIAGNOSIS — R7303 Prediabetes: Secondary | ICD-10-CM

## 2022-06-25 DIAGNOSIS — Z8546 Personal history of malignant neoplasm of prostate: Secondary | ICD-10-CM

## 2022-06-25 DIAGNOSIS — R7301 Impaired fasting glucose: Secondary | ICD-10-CM

## 2022-06-25 DIAGNOSIS — C61 Malignant neoplasm of prostate: Secondary | ICD-10-CM

## 2022-06-25 LAB — CBC WITH DIFFERENTIAL/PLATELET
Basophils Absolute: 0.1 10*3/uL (ref 0.0–0.1)
Basophils Relative: 0.9 % (ref 0.0–3.0)
Eosinophils Absolute: 0.4 10*3/uL (ref 0.0–0.7)
Eosinophils Relative: 4.9 % (ref 0.0–5.0)
HCT: 40.6 % (ref 39.0–52.0)
Hemoglobin: 13.1 g/dL (ref 13.0–17.0)
Lymphocytes Relative: 25.7 % (ref 12.0–46.0)
Lymphs Abs: 2 10*3/uL (ref 0.7–4.0)
MCHC: 32.3 g/dL (ref 30.0–36.0)
MCV: 83.8 fl (ref 78.0–100.0)
Monocytes Absolute: 0.9 10*3/uL (ref 0.1–1.0)
Monocytes Relative: 11.8 % (ref 3.0–12.0)
Neutro Abs: 4.5 10*3/uL (ref 1.4–7.7)
Neutrophils Relative %: 56.7 % (ref 43.0–77.0)
Platelets: 336 10*3/uL (ref 150.0–400.0)
RBC: 4.84 Mil/uL (ref 4.22–5.81)
RDW: 15.4 % (ref 11.5–15.5)
WBC: 8 10*3/uL (ref 4.0–10.5)

## 2022-06-25 LAB — LIPID PANEL
Cholesterol: 150 mg/dL (ref 0–200)
HDL: 45.1 mg/dL (ref >39.00)
LDL Cholesterol: 79 mg/dL (ref 0–99)
NonHDL: 104.4
Total CHOL/HDL Ratio: 3
Triglycerides: 127 mg/dL (ref 0.0–149.0)
VLDL: 25.4 mg/dL (ref 0.0–40.0)

## 2022-06-25 LAB — COMPREHENSIVE METABOLIC PANEL
ALT: 15 U/L (ref 0–53)
AST: 15 U/L (ref 0–37)
Albumin: 4.1 g/dL (ref 3.5–5.2)
Alkaline Phosphatase: 63 U/L (ref 39–117)
BUN: 15 mg/dL (ref 6–23)
CO2: 31 mEq/L (ref 19–32)
Calcium: 9.4 mg/dL (ref 8.4–10.5)
Chloride: 97 mEq/L (ref 96–112)
Creatinine, Ser: 1.04 mg/dL (ref 0.40–1.50)
GFR: 71.7 mL/min (ref >60.00)
Glucose, Bld: 103 mg/dL — ABNORMAL HIGH (ref 70–99)
Potassium: 4.3 mEq/L (ref 3.5–5.1)
Sodium: 138 mEq/L (ref 135–145)
Total Bilirubin: 0.5 mg/dL (ref 0.2–1.2)
Total Protein: 7.4 g/dL (ref 6.0–8.3)

## 2022-06-25 LAB — HEMOGLOBIN A1C: Hgb A1c MFr Bld: 6.8 % — ABNORMAL HIGH (ref 4.6–6.5)

## 2022-06-25 LAB — MICROALBUMIN / CREATININE URINE RATIO
Creatinine,U: 152.8 mg/dL
Microalb Creat Ratio: 2.1 mg/g (ref 0.0–30.0)
Microalb, Ur: 3.2 mg/dL — ABNORMAL HIGH (ref 0.0–1.9)

## 2022-06-25 LAB — PSA: PSA: 0.05 ng/mL — ABNORMAL LOW (ref 0.10–4.00)

## 2022-06-25 LAB — TSH: TSH: 4.25 u[IU]/mL (ref 0.35–5.50)

## 2022-06-25 NOTE — Patient Instructions (Addendum)
Good to see you today . Marland Kitchen Continue lifestyle intervention healthy eating and exercise .  Bp goal is 130/80 and below  Check readings at home and send in my chart  if not at goal we can adjust medication And you can  give more attention to diet .  To control blood pressure cholesterol and blood sugar . Lab today  if all ok then 6 mos check for hg A1c  ( blood sugar )   Wt Readings from Last 3 Encounters:  06/25/22 216 lb (98 kg)  01/10/22 216 lb (98 kg)  12/18/21 214 lb 2 oz (97.1 kg)

## 2022-07-09 NOTE — Progress Notes (Signed)
A1c back up some to 6.8  rest of lab results stable or at goal.  If interested we could add medication such as ozempic or mounjaro the injectable diabetes medication that helps also with weight loss .  Most insurance will cover ir you have diabetes need  and are on metformin . There are alos other meds to try    let me know and we can do a  virtal or fu visit to discuss more if interested . Either way  plan fu visit in 4 months to check hg A1c at office visit

## 2022-08-20 ENCOUNTER — Other Ambulatory Visit: Payer: Self-pay | Admitting: Internal Medicine

## 2022-09-25 DIAGNOSIS — K080 Exfoliation of teeth due to systemic causes: Secondary | ICD-10-CM | POA: Diagnosis not present

## 2022-11-08 DIAGNOSIS — B349 Viral infection, unspecified: Secondary | ICD-10-CM | POA: Diagnosis not present

## 2022-11-08 DIAGNOSIS — Z20822 Contact with and (suspected) exposure to covid-19: Secondary | ICD-10-CM | POA: Diagnosis not present

## 2022-11-08 DIAGNOSIS — R059 Cough, unspecified: Secondary | ICD-10-CM | POA: Diagnosis not present

## 2022-11-19 DIAGNOSIS — E119 Type 2 diabetes mellitus without complications: Secondary | ICD-10-CM | POA: Diagnosis not present

## 2022-11-19 DIAGNOSIS — H5203 Hypermetropia, bilateral: Secondary | ICD-10-CM | POA: Diagnosis not present

## 2022-11-19 LAB — HM DIABETES EYE EXAM

## 2022-12-23 ENCOUNTER — Encounter: Payer: Self-pay | Admitting: Internal Medicine

## 2022-12-23 NOTE — Telephone Encounter (Signed)
Do not need to fast for the appt.

## 2022-12-24 NOTE — Progress Notes (Unsigned)
No chief complaint on file.   HPI: Daniel Randall Sr. 72 y.o. come in for Chronic disease management   Fu BG  A1c  early diabetic range  ROS: See pertinent positives and negatives per HPI.  Past Medical History:  Diagnosis Date   Allergy    Atypical mole 12/30/2011   Right Side of Back(severe)   Atypical mole 03/10/2018   Left Side Outer(mild) (free)   BCC (basal cell carcinoma of skin) 02/18/2019   Behind Right Ear(residual) (excision)   Cataract    left eye removed with lens implant    Chronic kidney disease    kidney stones    Complication of anesthesia    Pt unaware of issue- denies difficult intubation, denies PONV    History of kidney stones    History of renal stone    Hx of bacterial pneumonia    hosp  2001   Hx of diverticulitis of colon    hosp 2001   Hyperlipidemia    Hypertension    Nodular basal cell carcinoma (BCC) 01/04/2013   Behind Right Ear   Pre-diabetes    Prostate CA (HCC) 10-24-11   7'13- bx. of prostate + cancer-surgery planned   Prostate cancer (HCC) 10/31/2011    Family History  Problem Relation Age of Onset   Diabetes Mother    Colon cancer Father 49       age dx'd 39- died 6 months later    Diabetes Sister        borderline   Diabetes Brother        borderline   ADD / ADHD Son    Colon polyps Neg Hx    Esophageal cancer Neg Hx    Rectal cancer Neg Hx    Stomach cancer Neg Hx     Social History   Socioeconomic History   Marital status: Married    Spouse name: Not on file   Number of children: Not on file   Years of education: Not on file   Highest education level: Not on file  Occupational History   Not on file  Tobacco Use   Smoking status: Never   Smokeless tobacco: Never  Vaping Use   Vaping status: Never Used  Substance and Sexual Activity   Alcohol use: Not Currently   Drug use: Never   Sexual activity: Not Currently  Other Topics Concern   Not on file  Social History Narrative   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    Accountant   2 cats and a dog   Social Determinants of Health   Financial Resource Strain: Low Risk  (06/25/2022)   Overall Financial Resource Strain (CARDIA)    Difficulty of Paying Living Expenses: Not hard at all  Food Insecurity: No Food Insecurity (06/25/2022)   Hunger Vital Sign    Worried About Running Out of Food in the Last Year: Never true    Ran Out of Food in the Last Year: Never true  Transportation Needs: No Transportation Needs (06/25/2022)   PRAPARE - Administrator, Civil Service (Medical): No    Lack of Transportation (Non-Medical): No  Physical Activity: Sufficiently Active (06/25/2022)   Exercise Vital Sign    Days of Exercise per Week: 7 days    Minutes of Exercise per Session: 40 min  Stress: No Stress Concern Present (06/25/2022)   [employer] of Occupational Health - Occupational Stress Questionnaire    Feeling of  Stress : Not at all  Social Connections: Socially Integrated (06/25/2022)   Social Connection and Isolation Panel [NHANES]    Frequency of Communication with Friends and Family: More than three times a week    Frequency of Social Gatherings with Friends and Family: Three times a week    Attends Religious Services: More than 4 times per year    Active Member of Clubs or Organizations: Yes    Attends Engineer, structural: More than 4 times per year    Marital Status: Married    Outpatient Medications Prior to Visit  Medication Sig Dispense Refill   atorvastatin (LIPITOR) 20 MG tablet TAKE 1 TABLET BY MOUTH EVERY DAY 90 tablet 1   hydrochlorothiazide (HYDRODIURIL) 25 MG tablet TAKE 1 TABLET BY MOUTH EVERY DAY 90 tablet 1   metFORMIN (GLUCOPHAGE) 500 MG tablet TAKE 2 TABLETS BY MOUTH EVERY MORNING and TAKE 2 TABLETS EVERY EVENING 360 tablet 2   telmisartan (MICARDIS) 80 MG tablet TAKE 1 TABLET BY MOUTH EVERY DAY 90 tablet 1   Facility-Administered Medications Prior to Visit  Medication Dose Route Frequency Provider Last  Rate Last Admin   0.9 %  sodium chloride infusion  500 mL Intravenous Once Armbruster, Willaim Rayas, MD         EXAM:  There were no vitals taken for this visit.  There is no height or weight on file to calculate BMI. Wt Readings from Last 3 Encounters:  06/25/22 216 lb (98 kg)  01/10/22 216 lb (98 kg)  12/18/21 214 lb 2 oz (97.1 kg)    GENERAL: vitals reviewed and listed above, alert, oriented, appears well hydrated and in no acute distress HEENT: atraumatic, conjunctiva  clear, no obvious abnormalities on inspection of external nose and ears OP : no lesion edema or exudate  NECK: no obvious masses on inspection palpation  LUNGS: clear to auscultation bilaterally, no wheezes, rales or rhonchi, good air movement CV: HRRR, no clubbing cyanosis or  peripheral edema nl cap refill  MS: moves all extremities without noticeable focal  abnormality PSYCH: pleasant and cooperative, no obvious depression or anxiety Lab Results  Component Value Date   WBC 8.0 06/25/2022   HGB 13.1 06/25/2022   HCT 40.6 06/25/2022   PLT 336.0 06/25/2022   GLUCOSE 103 (H) 06/25/2022   CHOL 150 06/25/2022   TRIG 127.0 06/25/2022   HDL 45.10 06/25/2022   LDLCALC 79 06/25/2022   ALT 15 06/25/2022   AST 15 06/25/2022   NA 138 06/25/2022   K 4.3 06/25/2022   CL 97 06/25/2022   CREATININE 1.04 06/25/2022   BUN 15 06/25/2022   CO2 31 06/25/2022   TSH 4.25 06/25/2022   PSA 0.05 (L) 06/25/2022   HGBA1C 6.8 (H) 06/25/2022   MICROALBUR 3.2 (H) 06/25/2022   BP Readings from Last 3 Encounters:  06/25/22 136/70  01/10/22 (!) 142/76  12/18/21 128/80    ASSESSMENT AND PLAN:  Discussed the following assessment and plan:  No diagnosis found.  -Patient advised to return or notify health care team  if  new concerns arise.  There are no Patient Instructions on file for this visit.   Neta Mends. Cordera Stineman [name]

## 2022-12-25 ENCOUNTER — Ambulatory Visit: Payer: Medicare Other | Admitting: Internal Medicine

## 2022-12-25 ENCOUNTER — Encounter: Payer: Self-pay | Admitting: Internal Medicine

## 2022-12-25 VITALS — BP 132/76 | HR 87 | Temp 97.8°F | Ht 65.0 in | Wt 215.6 lb

## 2022-12-25 DIAGNOSIS — I10 Essential (primary) hypertension: Secondary | ICD-10-CM | POA: Diagnosis not present

## 2022-12-25 DIAGNOSIS — E1165 Type 2 diabetes mellitus with hyperglycemia: Secondary | ICD-10-CM

## 2022-12-25 DIAGNOSIS — E785 Hyperlipidemia, unspecified: Secondary | ICD-10-CM

## 2022-12-25 DIAGNOSIS — Z79899 Other long term (current) drug therapy: Secondary | ICD-10-CM

## 2022-12-25 LAB — POCT GLYCOSYLATED HEMOGLOBIN (HGB A1C): Hemoglobin A1C: 6.5 % — AB (ref 4.0–5.6)

## 2022-12-25 NOTE — Patient Instructions (Addendum)
Good to see  you today . A1c is 6.5 stable better.  Continue lifestyle intervention healthy eating and exercise .   Plan cpe with labs in about 6 months.  Call ahead message to me  for lab orders pre visit  can get separate lab appt.Marland Kitchen

## 2022-12-26 ENCOUNTER — Encounter: Payer: Self-pay | Admitting: Internal Medicine

## 2023-01-23 ENCOUNTER — Other Ambulatory Visit: Payer: Self-pay | Admitting: Internal Medicine

## 2023-01-24 ENCOUNTER — Telehealth: Payer: Self-pay

## 2023-01-24 NOTE — Telephone Encounter (Signed)
 Copied from CRM [id number]. Topic: Clinical - Medication Refill >> Jan 24, 2023  9:22 AM Rosina BIRCH wrote: Most Recent Primary Care Visit:  Provider: CHARLETT APOLINAR POUR  Department: [facility]  Visit Type: OFFICE VISIT  Date: 12/25/2022  Medication: metFORMIN    Has the patient contacted their pharmacy? Yes (Agent: If no, request that the patient contact the pharmacy for the refill. If patient does not wish to contact the pharmacy document the reason why and proceed with request.) (Agent: If yes, when and what did the pharmacy advise?)  Is this the correct pharmacy for this prescription? Yes If no, delete pharmacy and type the correct one.  This is the patient's preferred pharmacy:   [facility] - [place], [place] - [address] [place] [address] Phone: [phone] Fax: [phone]   Has the prescription been filled recently? No  Is the patient out of the medication? Yes  Has the patient been seen for an appointment in the last year OR does the patient have an upcoming appointment? Yes  Can we respond through MyChart? Yes  Agent: Please be advised that Rx refills may take up to 3 business days. We ask that you follow-up with your pharmacy.

## 2023-01-25 ENCOUNTER — Other Ambulatory Visit: Payer: Self-pay | Admitting: Family

## 2023-01-25 MED ORDER — METFORMIN HCL 500 MG PO TABS: ORAL_TABLET | ORAL | 2 refills | Status: DC

## 2023-02-20 ENCOUNTER — Other Ambulatory Visit: Payer: Self-pay | Admitting: Internal Medicine

## 2023-03-29 IMAGING — CT CT RENAL STONE PROTOCOL
2 of 4 series · 15 of 46 positions shown, 17 images · non-contrast
Comparison: CT Abdomen and Pelvis 05/23/2016.

CLINICAL DATA: 70-year-old male with severe right flank pain this
morning. History of kidney stones and prostate cancer.

EXAM:
CT ABDOMEN AND PELVIS WITHOUT CONTRAST
TECHNIQUE: Multidetector CT imaging of the abdomen and pelvis was performed
following the standard protocol without IV contrast.

[Series 2: stone full · axial · 0.90mm/px · z∈[+599,+1064]mm · 12 of 103 slices shown, 14 images]
[im 5/103  soft-tissue]
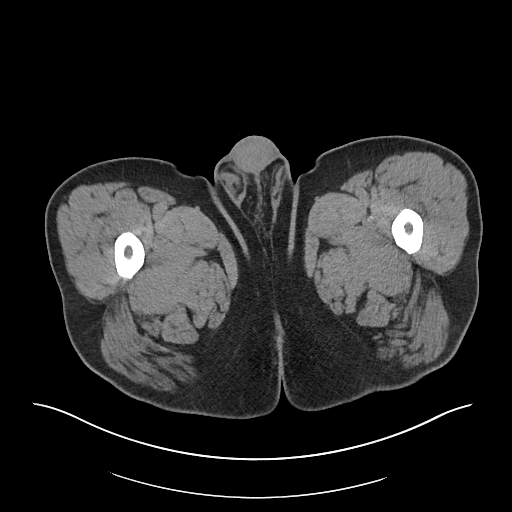
[im 5/103  bone]
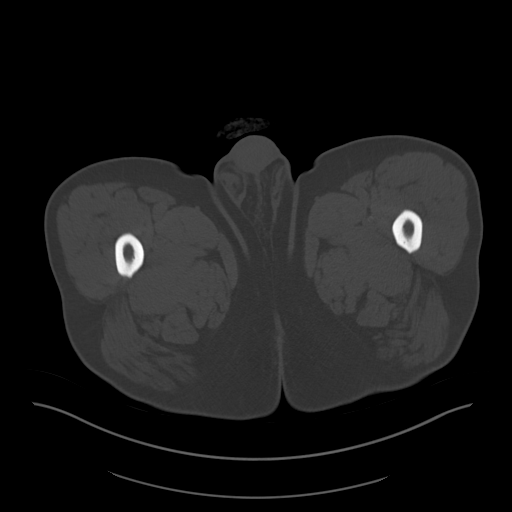
[im 13/103  soft-tissue]
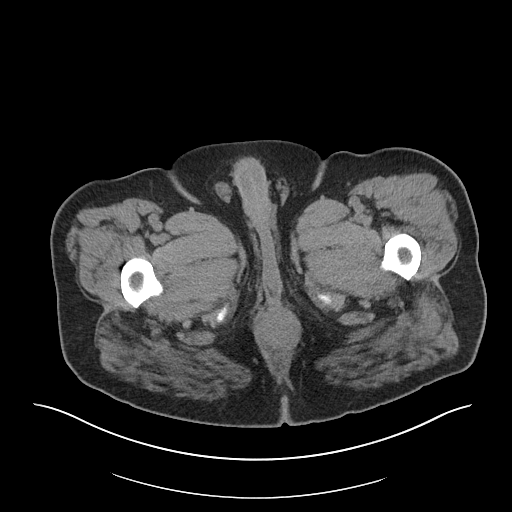
[im 22/103  soft-tissue]
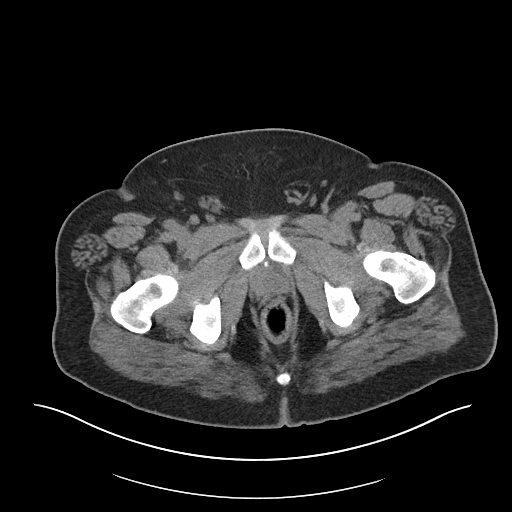
[im 30/103  soft-tissue]
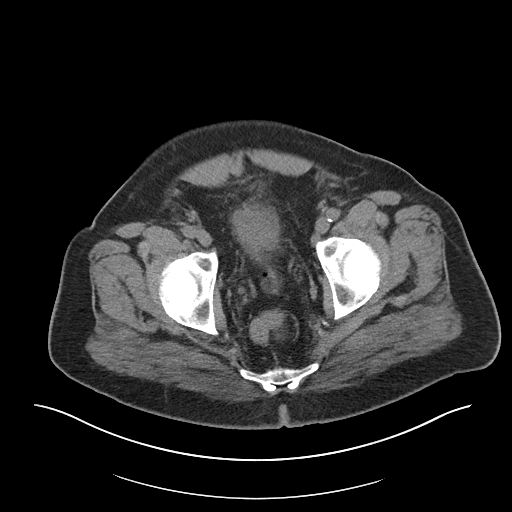
[im 39/103  soft-tissue]
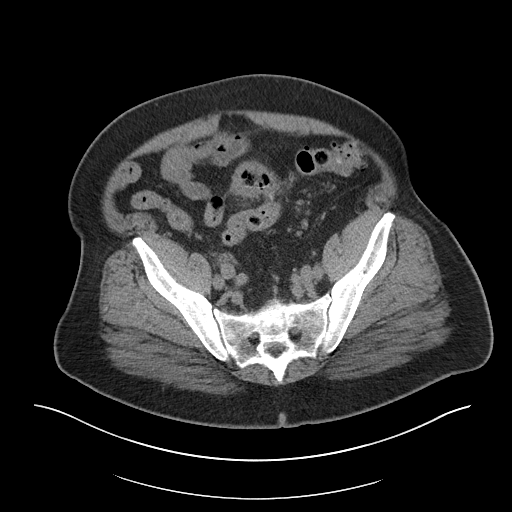
[im 47/103  soft-tissue]
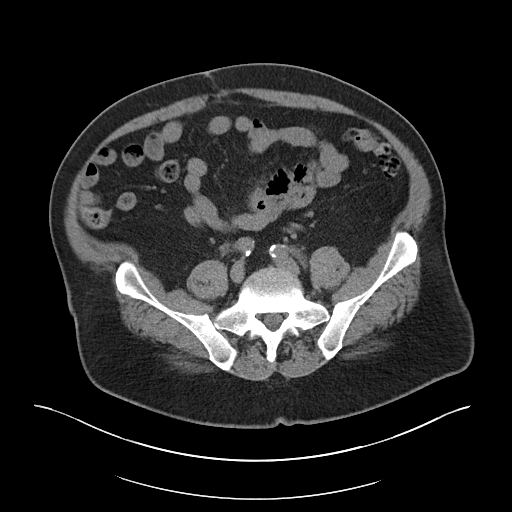
[im 56/103  soft-tissue]
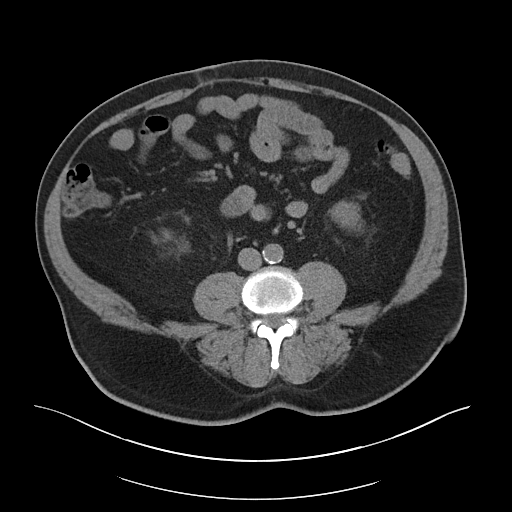
[im 64/103  soft-tissue]
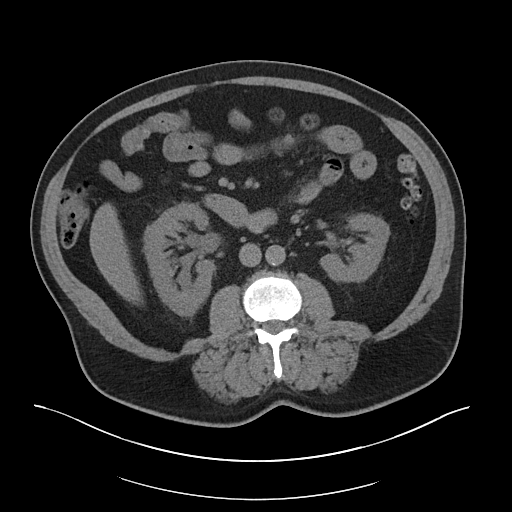
[im 73/103  soft-tissue]
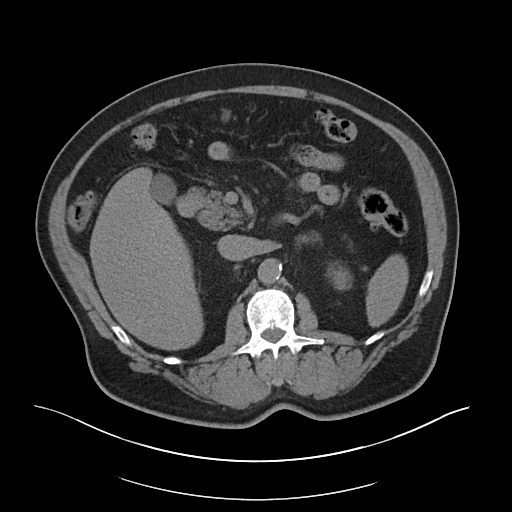
[im 73/103  bone]
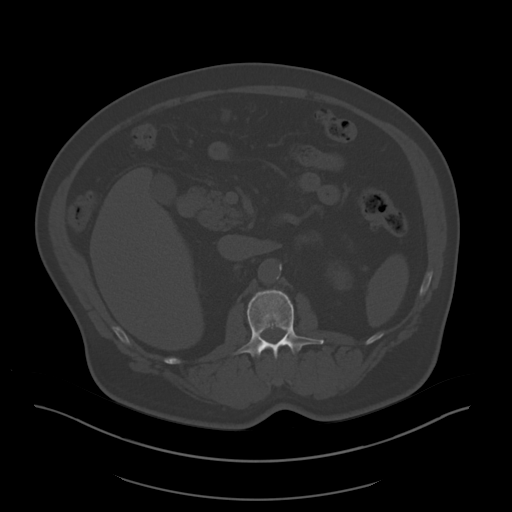
[im 81/103  soft-tissue]
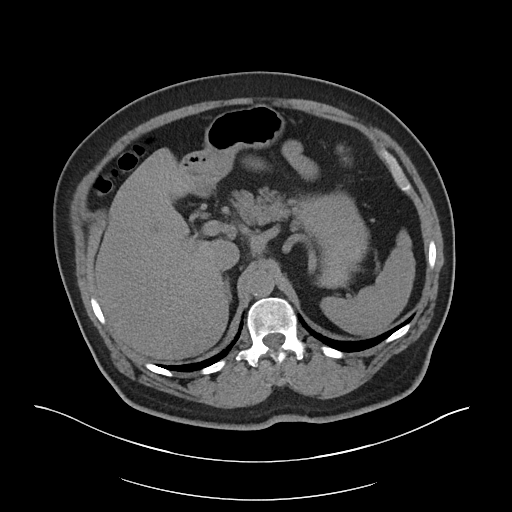
[im 90/103  soft-tissue]
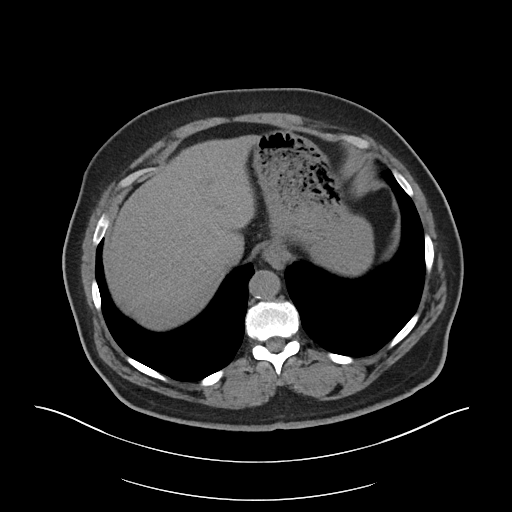
[im 98/103  soft-tissue]
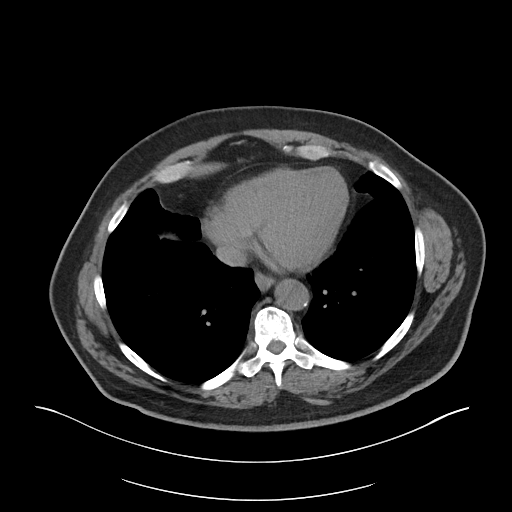

[Series 5: coronal · coronal · 0.82mm/px · 3 of 112 slices shown]
[im 38/112  soft-tissue]
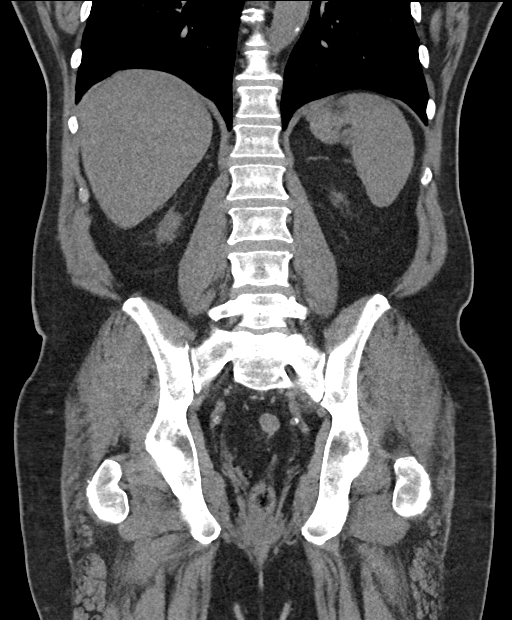
[im 50/112  soft-tissue]
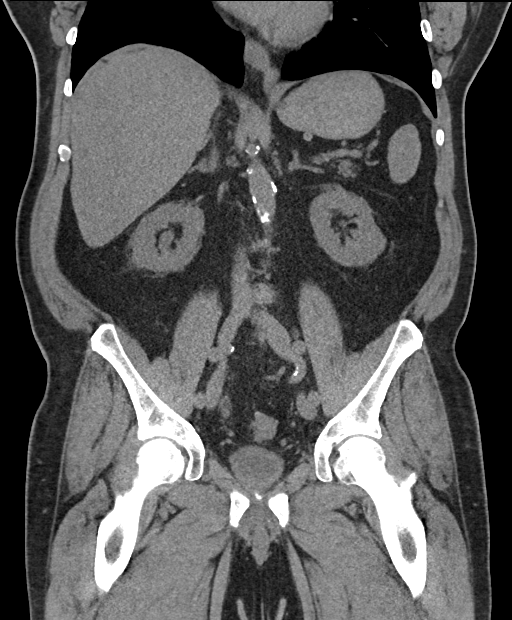
[im 62/112  soft-tissue]
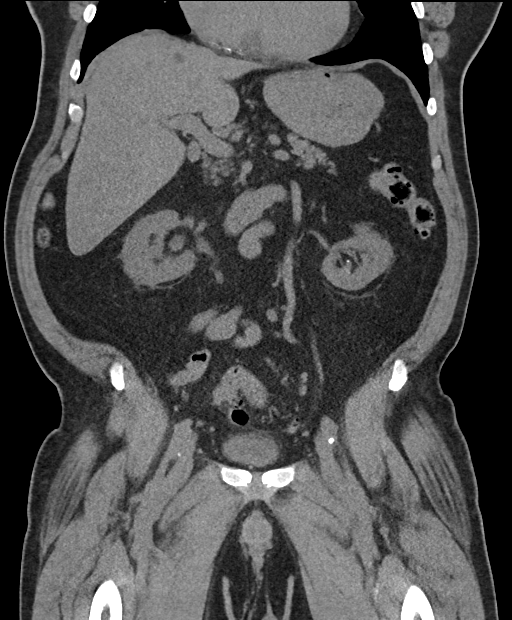

[15 of 46 positions shown; findings below may reference images not displayed]

FINDINGS: Lower chest: Negative.

Hepatobiliary: Stable small benign appearing hepatic cysts. Negative
gallbladder.

Pancreas: Negative.

Spleen: Negative.

Adrenals/Urinary Tract: Normal adrenal glands. Left nephrolithiasis
with no hydronephrosis. Negative left ureter.

Right nephrolithiasis, with punctate to 2-3 mm calculi and
superimposed acute right hydronephrosis with right hydroureter and
periureteral stranding continuing into the pelvis. Distal ureter 3-4
mm obstructing calculus on series 2, image 76 and coronal image 45
superimposed on chronic right hemipelvis phleboliths. The calculus
is within 1 cm of the right ureterovesical junction. Bladder is
decompressed, although there is perivesical inflammatory stranding
at the fundus which is located near a chronic large sigmoid colon
diverticula. See sagittal image 69.

Stomach/Bowel: Negative rectum. Chronically abnormal sigmoid colon
with a "giant" 4-5 cm diverticulum (series 2, image 67) or chronic
contained perforation superimposed on severe diverticulosis of the
large bowel a from the hepatic flexure distally. The sigmoid appears
stable since 3946. No active inflammation identified. Normal
appendix on series 2, image 62. Negative terminal ileum. No dilated
small bowel. Chronic postoperative changes to the ventral abdominal
wall with a small fat containing umbilical hernia. Negative stomach
and duodenum. No free air or free fluid.

Vascular/Lymphatic: Aortoiliac calcified atherosclerosis. Normal
caliber abdominal aorta. No lymphadenopathy.

Reproductive: Negative.

Other: No pelvic free fluid.

Musculoskeletal: No acute or suspicious osseous lesion.
IMPRESSION: 1. Acute obstructive uropathy on the right due to a 3-4 mm distal
right ureteral calculus located within 1 cm of the right UVJ.
Underlying bilateral nephrolithiasis.

2. Chronic sigmoid colon "giant diverticulum" (4-5 cm) versus a
chronic contained perforation. Extensive underlying large bowel
diverticulosis. The findings are stable since the 3946 CT, however,
there is associated chronic inflammation between that lesion and the
bladder dome (sagittal image 69).

3. Aortic Atherosclerosis ([id number]).

## 2023-04-07 DIAGNOSIS — K080 Exfoliation of teeth due to systemic causes: Secondary | ICD-10-CM | POA: Diagnosis not present

## 2023-07-17 ENCOUNTER — Encounter: Payer: Self-pay | Admitting: Internal Medicine

## 2023-07-17 ENCOUNTER — Ambulatory Visit: Payer: Medicare Other | Admitting: Internal Medicine

## 2023-07-17 VITALS — BP 150/78 | HR 81 | Temp 98.2°F | Ht 64.99 in | Wt 216.6 lb

## 2023-07-17 DIAGNOSIS — R011 Cardiac murmur, unspecified: Secondary | ICD-10-CM | POA: Diagnosis not present

## 2023-07-17 DIAGNOSIS — Z0001 Encounter for general adult medical examination with abnormal findings: Secondary | ICD-10-CM

## 2023-07-17 DIAGNOSIS — Z8546 Personal history of malignant neoplasm of prostate: Secondary | ICD-10-CM

## 2023-07-17 DIAGNOSIS — I10 Essential (primary) hypertension: Secondary | ICD-10-CM

## 2023-07-17 DIAGNOSIS — Z0000 Encounter for general adult medical examination without abnormal findings: Secondary | ICD-10-CM

## 2023-07-17 DIAGNOSIS — E1169 Type 2 diabetes mellitus with other specified complication: Secondary | ICD-10-CM

## 2023-07-17 DIAGNOSIS — Z79899 Other long term (current) drug therapy: Secondary | ICD-10-CM | POA: Diagnosis not present

## 2023-07-17 DIAGNOSIS — E669 Obesity, unspecified: Secondary | ICD-10-CM

## 2023-07-17 DIAGNOSIS — E785 Hyperlipidemia, unspecified: Secondary | ICD-10-CM | POA: Diagnosis not present

## 2023-07-17 DIAGNOSIS — E1165 Type 2 diabetes mellitus with hyperglycemia: Secondary | ICD-10-CM | POA: Diagnosis not present

## 2023-07-17 LAB — CBC WITH DIFFERENTIAL/PLATELET
Basophils Absolute: 0.1 10*3/uL (ref 0.0–0.1)
Basophils Relative: 0.9 % (ref 0.0–3.0)
Eosinophils Absolute: 0.3 10*3/uL (ref 0.0–0.7)
Eosinophils Relative: 4 % (ref 0.0–5.0)
HCT: 42.1 % (ref 39.0–52.0)
Hemoglobin: 13.8 g/dL (ref 13.0–17.0)
Lymphocytes Relative: 25.4 % (ref 12.0–46.0)
Lymphs Abs: 2.1 10*3/uL (ref 0.7–4.0)
MCHC: 32.6 g/dL (ref 30.0–36.0)
MCV: 81.9 fl (ref 78.0–100.0)
Monocytes Absolute: 0.7 10*3/uL (ref 0.1–1.0)
Monocytes Relative: 8.6 % (ref 3.0–12.0)
Neutro Abs: 5 10*3/uL (ref 1.4–7.7)
Neutrophils Relative %: 61.1 % (ref 43.0–77.0)
Platelets: 326 10*3/uL (ref 150.0–400.0)
RBC: 5.14 Mil/uL (ref 4.22–5.81)
RDW: 15.7 % — ABNORMAL HIGH (ref 11.5–15.5)
WBC: 8.2 10*3/uL (ref 4.0–10.5)

## 2023-07-17 LAB — BASIC METABOLIC PANEL WITH GFR
BUN: 19 mg/dL (ref 6–23)
CO2: 29 meq/L (ref 19–32)
Calcium: 9.7 mg/dL (ref 8.4–10.5)
Chloride: 97 meq/L (ref 96–112)
Creatinine, Ser: 1.06 mg/dL (ref 0.40–1.50)
GFR: 69.56 mL/min (ref >60.00)
Glucose, Bld: 111 mg/dL — ABNORMAL HIGH (ref 70–99)
Potassium: 3.9 meq/L (ref 3.5–5.1)
Sodium: 138 meq/L (ref 135–145)

## 2023-07-17 LAB — LIPID PANEL
Cholesterol: 166 mg/dL (ref 0–200)
HDL: 42.5 mg/dL (ref >39.00)
LDL Cholesterol: 97 mg/dL (ref 0–99)
NonHDL: 123.62
Total CHOL/HDL Ratio: 4
Triglycerides: 135 mg/dL (ref 0.0–149.0)
VLDL: 27 mg/dL (ref 0.0–40.0)

## 2023-07-17 LAB — MICROALBUMIN / CREATININE URINE RATIO
Creatinine,U: 177.9 mg/dL
Microalb Creat Ratio: 32.3 mg/g — ABNORMAL HIGH (ref 0.0–30.0)
Microalb, Ur: 5.8 mg/dL — ABNORMAL HIGH (ref 0.0–1.9)

## 2023-07-17 LAB — PSA: PSA: 0.06 ng/mL — ABNORMAL LOW (ref 0.10–4.00)

## 2023-07-17 LAB — TSH: TSH: 4.81 u[IU]/mL (ref 0.35–5.50)

## 2023-07-17 LAB — HEPATIC FUNCTION PANEL
ALT: 15 U/L (ref 0–53)
AST: 14 U/L (ref 0–37)
Albumin: 4.2 g/dL (ref 3.5–5.2)
Alkaline Phosphatase: 60 U/L (ref 39–117)
Bilirubin, Direct: 0.2 mg/dL (ref 0.0–0.3)
Total Bilirubin: 0.7 mg/dL (ref 0.2–1.2)
Total Protein: 7.4 g/dL (ref 6.0–8.3)

## 2023-07-17 LAB — VITAMIN B12: Vitamin B-12: 407 pg/mL (ref 211–911)

## 2023-07-17 LAB — HEMOGLOBIN A1C: Hgb A1c MFr Bld: 7 % — ABNORMAL HIGH (ref 4.6–6.5)

## 2023-07-17 MED ORDER — TELMISARTAN 80 MG PO TABS: 80.0000 mg | ORAL_TABLET | Freq: Every day | ORAL | 3 refills | Status: AC

## 2023-07-17 MED ORDER — METFORMIN HCL 500 MG PO TABS: ORAL_TABLET | ORAL | 3 refills | Status: AC

## 2023-07-17 MED ORDER — HYDROCHLOROTHIAZIDE 25 MG PO TABS: 25.0000 mg | ORAL_TABLET | Freq: Every day | ORAL | 3 refills | Status: AC

## 2023-07-17 MED ORDER — ATORVASTATIN CALCIUM 20 MG PO TABS: 20.0000 mg | ORAL_TABLET | Freq: Every day | ORAL | 3 refills | Status: AC

## 2023-07-17 NOTE — Progress Notes (Signed)
 Chief Complaint  Patient presents with   Annual Exam    Pt reports he is fasting this morning and taken his BP meds.    Medication Refill    Pt is due for his med refills.     HPI: Patient  Daniel COSTABILE Sr.  73 y.o. comes in today for Preventive Health Care visit  And  Chronic disease management  DM early: not checking but taking meds metformin  2 x 500 bid feels ok  HT not checking reading  but has been ok taking micardis  80 and hydrochlorothiazide  25  HLD:  on atorva  no se reported  No new gu sx   Health Maintenance  Topic Date Due   Medicare Annual Wellness (AWV)  Never done   COVID-19 Vaccine (3 - Pfizer risk series) 08/02/2023 (Originally 09/19/2019)   DTaP/Tdap/Td (3 - Td or Tdap) 07/16/2024 (Originally 05/13/2022)   Pneumococcal Vaccine: 50+ Years (3 of 3 - PCV20 or PCV21) 07/16/2024 (Originally 06/03/2021)   Hepatitis C Screening  07/16/2024 (Originally 11/29/1968)   INFLUENZA VACCINE  08/22/2023   OPHTHALMOLOGY EXAM  11/19/2023   HEMOGLOBIN A1C  01/16/2024   Diabetic kidney evaluation - eGFR measurement  07/16/2024   Diabetic kidney evaluation - Urine ACR  07/16/2024   FOOT EXAM  07/16/2024   Zoster Vaccines- Shingrix  Completed   Hepatitis B Vaccines  Aged Out   HPV VACCINES  Aged Out   Meningococcal B Vaccine  Aged Out   Colonoscopy  Discontinued   Health Maintenance Review LIFESTYLE:  Exercise:   goes to y 5-6 days per week. Tobacco/ETS:  n Alcohol:   rare Sugar beverages:  less than one a day. Sleep:  about 10 hours  Drug use: no HH of  2   Retired reads a lot   ROS:  GEN/ HEENT: No fever, significant weight changes sweats headaches vision problems hearing changes,( wife says  some concerns)  CV/ PULM; No chest pain shortness of breath cough, syncope,edema  change in exercise tolerance. GI /GU: No adominal pain, vomiting, change in bowel habits. No blood in the stool. No significant GU symptoms. SKIN/HEME: ,no acute skin rashes suspicious lesions or  bleeding. No lymphadenopathy, nodules, masses.  NEURO/ PSYCH:  No neurologic signs such as weakness numbness. No depression anxiety. IMM/ Allergy: No unusual infections.  Allergy .   REST of 12 system review negative except as per HPI   Past Medical History:  Diagnosis Date   Allergy    Atypical mole 12/30/2011   Right Side of Back(severe)   Atypical mole 03/10/2018   Left Side Outer(mild) (free)   BCC (basal cell carcinoma of skin) 02/18/2019   Behind Right Ear(residual) (excision)   Cataract    left eye removed with lens implant    Chronic kidney disease    kidney stones    Complication of anesthesia    Pt unaware of issue- denies difficult intubation, denies PONV    History of kidney stones    History of renal stone    Hx of bacterial pneumonia    hosp  2001   Hx of diverticulitis of colon    hosp 2001   Hyperlipidemia    Hypertension    Nodular basal cell carcinoma (BCC) 01/04/2013   Behind Right Ear   Pre-diabetes    Prostate CA (HCC) 10-24-11   7'13- bx. of prostate + cancer-surgery planned   Prostate cancer (HCC) 10/31/2011    Past Surgical History:  Procedure Laterality Date  COLONOSCOPY     EXTRACORPOREAL SHOCK WAVE LITHOTRIPSY Left 05/30/2016   Procedure: LEFT EXTRACORPOREAL SHOCK WAVE LITHOTRIPSY (ESWL);  Surgeon: Sherrilee Belvie CROME, MD;  Location: [facility];  Service: Urology;  Laterality: Left;   POLYPECTOMY     ROBOT ASSISTED LAPAROSCOPIC RADICAL PROSTATECTOMY  10/28/2011   Procedure: ROBOTIC ASSISTED LAPAROSCOPIC RADICAL PROSTATECTOMY;  Surgeon: Norleen JINNY Seltzer, MD;  Location: [facility];  Service: Urology;  Laterality: N/A;   SHOULDER SURGERY     lt. arthroscopy   WISDOM TOOTH EXTRACTION  10-24-11   x2 extracted    Family History  Problem Relation Age of Onset   Diabetes Mother    Colon cancer Father 35       age dx'd 35- died 6 months later    Diabetes Sister        borderline   Diabetes Brother        borderline   ADD / ADHD Son    Colon polyps Neg  Hx    Esophageal cancer Neg Hx    Rectal cancer Neg Hx    Stomach cancer Neg Hx     Social History   Socioeconomic History   Marital status: Married    Spouse name: Not on file   Number of children: Not on file   Years of education: Not on file   Highest education level: Not on file  Occupational History   Not on file  Tobacco Use   Smoking status: Never   Smokeless tobacco: Never  Vaping Use   Vaping status: Never Used  Substance and Sexual Activity   Alcohol use: Not Currently   Drug use: Never   Sexual activity: Not Currently  Other Topics Concern   Not on file  Social History Narrative   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    Accountant   2 cats and a dog   Social Drivers of Corporate investment banker Strain: Low Risk  (06/25/2022)   Overall Financial Resource Strain (CARDIA)    Difficulty of Paying Living Expenses: Not hard at all  Food Insecurity: No Food Insecurity (06/25/2022)   Hunger Vital Sign    Worried About Running Out of Food in the Last Year: Never true    Ran Out of Food in the Last Year: Never true  Transportation Needs: No Transportation Needs (06/25/2022)   PRAPARE - Administrator, Civil Service (Medical): No    Lack of Transportation (Non-Medical): No  Physical Activity: Sufficiently Active (06/25/2022)   Exercise Vital Sign    Days of Exercise per Week: 7 days    Minutes of Exercise per Session: 40 min  Stress: No Stress Concern Present (06/25/2022)   [employer] of Occupational Health - Occupational Stress Questionnaire    Feeling of Stress : Not at all  Social Connections: Socially Integrated (06/25/2022)   Social Connection and Isolation Panel    Frequency of Communication with Friends and Family: More than three times a week    Frequency of Social Gatherings with Friends and Family: Three times a week    Attends Religious Services: More than 4 times per year    Active Member of Clubs or Organizations: Yes    Attends Tax inspector Meetings: More than 4 times per year    Marital Status: Married    Outpatient Medications Prior to Visit  Medication Sig Dispense Refill   atorvastatin  (LIPITOR) 20 MG tablet TAKE 1 TABLET BY MOUTH EVERY DAY  90 tablet 1   hydrochlorothiazide  (HYDRODIURIL ) 25 MG tablet TAKE 1 TABLET BY MOUTH EVERY DAY 90 tablet 1   metFORMIN  (GLUCOPHAGE ) 500 MG tablet TAKE 2 TABLETS BY MOUTH EVERY MORNING AND TAKE 2 TABLETS EVERY EVENING 360 tablet 2   telmisartan  (MICARDIS ) 80 MG tablet TAKE 1 TABLET BY MOUTH EVERY DAY 90 tablet 1   Facility-Administered Medications Prior to Visit  Medication Dose Route Frequency Provider Last Rate Last Admin   0.9 %  sodium chloride  infusion  500 mL Intravenous Once Armbruster, Elspeth SQUIBB, MD         EXAM:  BP (!) 150/78 (BP Location: Right Arm, Patient Position: Sitting, Cuff Size: Large)   Pulse 81   Temp 98.2 F (36.8 C) (Oral)   Ht 5' 4.99 (1.651 m)   Wt 216 lb 9.6 oz (98.2 kg)   SpO2 97%   BMI 36.06 kg/m   Body mass index is 36.06 kg/m. Wt Readings from Last 3 Encounters:  07/17/23 216 lb 9.6 oz (98.2 kg)  12/25/22 215 lb 9.6 oz (97.8 kg)  06/25/22 216 lb (98 kg)    Physical Exam: Vital signs reviewed HZW:Uypd is a well-developed well-nourished alert cooperative    who appearsr stated age in no acute distress.  HEENT: normocephalic atraumatic , Eyes: PERRL EOM's full, conjunctiva clear, Nares: paten,t no deformity discharge or tenderness., Ears: no deformity EAC's clear TMs with normal landmarks. Mouth: clear OP, no lesions, edema.  Moist mucous membranes. Dentition in adequate repair. NECK: supple without masses, thyromegaly or bruits. CHEST/PULM:  Clear to auscultation and percussion breath sounds equal no wheeze , rales or rhonchi. No chest wall deformities or tenderness. CV: PMI is nondisplaced, S1 S2 no gallops,2-3 systolic m lusb sitting and laying  no radiation no g  rubs. Peripheral pulses are full without delay.No JVD .  ABDOMEN:  Bowel sounds normal nontender  No guard or rebound, no hepato splenomegal no CVA tenderness. Extremtities:  No clubbing cyanosis or edema, no acute joint swelling or redness no focal atrophy NEURO:  Oriented x3, cranial nerves 3-12 appear to be intact, no obvious focal weakness,gait within normal limits no abnormal reflexes or asymmetrical SKIN: No acute rashes normal turgor, color, no bruising or petechiae. Multiple sks  PSYCH: Oriented, good eye contact, no obvious depression anxiety, cognition and judgment appear normal. LN: no cervical axillary adenopathy  Diabetic foot exam was performed with the following findings:   No deformities, ulcerations, or other skin breakdown Normal sensation of 10g monofilament Intact posterior tibialis and dorsalis pedis pulses     Lab Results  Component Value Date   WBC 8.2 07/17/2023   HGB 13.8 07/17/2023   HCT 42.1 07/17/2023   PLT 326.0 07/17/2023   GLUCOSE 111 (H) 07/17/2023   CHOL 166 07/17/2023   TRIG 135.0 07/17/2023   HDL 42.50 07/17/2023   LDLCALC 97 07/17/2023   ALT 15 07/17/2023   AST 14 07/17/2023   NA 138 07/17/2023   K 3.9 07/17/2023   CL 97 07/17/2023   CREATININE 1.06 07/17/2023   BUN 19 07/17/2023   CO2 29 07/17/2023   TSH 4.81 07/17/2023   PSA 0.06 (L) 07/17/2023   HGBA1C 7.0 (H) 07/17/2023   MICROALBUR 5.8 (H) 07/17/2023    BP Readings from Last 3 Encounters:  07/17/23 (!) 150/78  12/25/22 132/76  06/25/22 136/70    Labplan  reviewed with patient   ASSESSMENT AND PLAN:  Discussed the following assessment and plan:    ICD-10-CM  1. Visit for preventive health examination  Z00.00     2. Type 2 diabetes mellitus with hyperglycemia, without long-term current use of insulin (HCC)  E11.65 Vitamin B12    Basic metabolic panel with GFR    CBC with Differential/Platelet    Hemoglobin A1c    Hepatic function panel    Lipid panel    TSH    Microalbumin / creatinine urine ratio    3. Heart murmur  R01.1  ECHOCARDIOGRAM COMPLETE   seemingly new finding today no sx or other abd exam is fasting state plan echo    4. Personal history of prostate cancer  Z85.46 Vitamin B12    Basic metabolic panel with GFR    CBC with Differential/Platelet    Hemoglobin A1c    Hepatic function panel    Lipid panel    TSH    Microalbumin / creatinine urine ratio    PSA    5. Essential hypertension  I10 Vitamin B12    Basic metabolic panel with GFR    CBC with Differential/Platelet    Hemoglobin A1c    Hepatic function panel    Lipid panel    TSH    Microalbumin / creatinine urine ratio    ECHOCARDIOGRAM COMPLETE    6. Medication management  Z79.899 Vitamin B12    Basic metabolic panel with GFR    CBC with Differential/Platelet    Hemoglobin A1c    Hepatic function panel    Lipid panel    TSH    Microalbumin / creatinine urine ratio    PSA    7. Hyperlipidemia, unspecified hyperlipidemia type  E78.5 Vitamin B12    Basic metabolic panel with GFR    CBC with Differential/Platelet    Hemoglobin A1c    Hepatic function panel    Lipid panel    TSH    Microalbumin / creatinine urine ratio    8. Type 2 diabetes mellitus with obesity (HCC)  E11.69 Vitamin B12   E66.9 Basic metabolic panel with GFR    CBC with Differential/Platelet    Hemoglobin A1c    Hepatic function panel    Lipid panel    TSH    Microalbumin / creatinine urine ratio    Update lab monitoring  Metabolic  risk  optimize bp disc. Had been at goal Cont exercise  diet changes attempts  consider glp1 or other depending  Refill medication today .  May need intensification  consider adding glp1 etc.  No new sx  Follow psa  no ? Need urology fu since no sx  Murmur heard  2 positions   get m to assess He states may have been heard in past  denies sx   Expectant management.  Denies cognitive decline  seems a bit diffuse today  no formal testing today.  Return in about 3 months (around 10/17/2023).  Patient Care Team: Kaydyn Chism,  Apolinar POUR, MD as PCP - General McKenzie, Belvie CROME, MD as Consulting Physician (Urology) Porter Andrez SAUNDERS, PA-C as Physician Assistant (Dermatology) Livingston Rigg, MD (Inactive) as Consulting Physician (Dermatology) [place], Heather, OD [facility]) Patient Instructions  Good  to see you today Update labs  You bp readings  in office are not at goal.  Take blood pressure readings twice a day for 3-5 days and then periodically .  SABRASend in readings     Goal is average below 130/80. We may need to adjust medication if not at goal .  I hear a murmur  today but all otherwise ok   After lab back I will probably have you get an echocardiogram ( sound wave test  to make sure  all is working ok ) sometimes can hear this  sound and not from heart.   Not worrisome but needs follow up.   Plan fu in about 3 months to ensure  bg and bp in range we wish     Kelven Flater K. Hershy Flenner [name]

## 2023-07-17 NOTE — Patient Instructions (Addendum)
 Good  to see you today Update labs  You bp readings  in office are not at goal.  Take blood pressure readings twice a day for 3-5 days and then periodically .  SABRASend in readings     Goal is average below 130/80. We may need to adjust medication if not at goal .  I hear a murmur today but all otherwise ok   After lab back I will probably have you get an echocardiogram ( sound wave test  to make sure  all is working ok ) sometimes can hear this  sound and not from heart.   Not worrisome but needs follow up.   Plan fu in about 3 months to ensure  bg and bp in range we wish

## 2023-08-01 ENCOUNTER — Telehealth (HOSPITAL_COMMUNITY): Payer: Self-pay | Admitting: Internal Medicine

## 2023-08-01 NOTE — Telephone Encounter (Signed)
 I called to schedule the ordered Echocardiogram by Dr. Charlett and patient does not wish to schedule at this time. Order will e removed from the echo WQ.

## 2023-08-02 NOTE — Telephone Encounter (Signed)
 Noted

## 2023-08-19 DIAGNOSIS — L720 Epidermal cyst: Secondary | ICD-10-CM | POA: Diagnosis not present

## 2023-09-17 DIAGNOSIS — L98499 Non-pressure chronic ulcer of skin of other sites with unspecified severity: Secondary | ICD-10-CM | POA: Diagnosis not present

## 2023-09-17 DIAGNOSIS — L720 Epidermal cyst: Secondary | ICD-10-CM | POA: Diagnosis not present

## 2023-09-17 DIAGNOSIS — L570 Actinic keratosis: Secondary | ICD-10-CM | POA: Diagnosis not present

## 2023-09-17 DIAGNOSIS — D485 Neoplasm of uncertain behavior of skin: Secondary | ICD-10-CM | POA: Diagnosis not present

## 2023-10-16 DIAGNOSIS — K080 Exfoliation of teeth due to systemic causes: Secondary | ICD-10-CM | POA: Diagnosis not present

## 2023-10-21 ENCOUNTER — Encounter: Payer: Self-pay | Admitting: Internal Medicine

## 2023-10-21 ENCOUNTER — Ambulatory Visit: Admitting: Internal Medicine

## 2023-10-21 ENCOUNTER — Ambulatory Visit: Payer: Self-pay | Admitting: Internal Medicine

## 2023-10-21 VITALS — BP 128/76 | HR 97 | Temp 98.3°F | Ht 64.99 in | Wt 215.8 lb

## 2023-10-21 DIAGNOSIS — Z79899 Other long term (current) drug therapy: Secondary | ICD-10-CM | POA: Diagnosis not present

## 2023-10-21 DIAGNOSIS — I10 Essential (primary) hypertension: Secondary | ICD-10-CM | POA: Diagnosis not present

## 2023-10-21 DIAGNOSIS — E1165 Type 2 diabetes mellitus with hyperglycemia: Secondary | ICD-10-CM | POA: Diagnosis not present

## 2023-10-21 DIAGNOSIS — R011 Cardiac murmur, unspecified: Secondary | ICD-10-CM | POA: Diagnosis not present

## 2023-10-21 LAB — MICROALBUMIN / CREATININE URINE RATIO
Creatinine,U: 185.2 mg/dL
Microalb Creat Ratio: 14 mg/g (ref 0.0–30.0)
Microalb, Ur: 2.6 mg/dL — ABNORMAL HIGH (ref 0.0–1.9)

## 2023-10-21 LAB — POCT GLYCOSYLATED HEMOGLOBIN (HGB A1C): Hemoglobin A1C: 6.4 % — AB (ref 4.0–5.6)

## 2023-10-21 NOTE — Patient Instructions (Signed)
 A1c is much better  6.4    Continue lifestyle intervention healthy eating and exercise .  Update urine for protein today .   Plan 6 months check . April 26 .

## 2023-10-21 NOTE — Progress Notes (Signed)
 Chief Complaint  Patient presents with   Medical Management of Chronic Issues    HPI: Daniel JANIK Sr. 73 y.o. come in for Chronic disease management   DM:  metformin   no sx  Eating buying less sweets .chocolates   Walked  daily since march . And going to y 5-6 x per week.  Feels fine  no dm sx   HT same meds  Murmur no cv sx and declined getting the echocardiogram  denies exercise limitations   ROS: See pertinent positives and negatives per HPI. Past Medical History:  Diagnosis Date   Allergy    Atypical mole 12/30/2011   Right Side of Back(severe)   Atypical mole 03/10/2018   Left Side Outer(mild) (free)   BCC (basal cell carcinoma of skin) 02/18/2019   Behind Right Ear(residual) (excision)   Cataract    left eye removed with lens implant    Chronic kidney disease    kidney stones    Complication of anesthesia    Pt unaware of issue- denies difficult intubation, denies PONV    History of kidney stones    History of renal stone    Hx of bacterial pneumonia    hosp  2001   Hx of diverticulitis of colon    hosp 2001   Hyperlipidemia    Hypertension    Nodular basal cell carcinoma (BCC) 01/04/2013   Behind Right Ear   Pre-diabetes    Prostate CA (HCC) 10-24-11   7'13- bx. of prostate + cancer-surgery planned   Prostate cancer (HCC) 10/31/2011    Family History  Problem Relation Age of Onset   Diabetes Mother    Colon cancer Father 50       age dx'd 57- died 6 months later    Diabetes Sister        borderline   Diabetes Brother        borderline   ADD / ADHD Son    Colon polyps Neg Hx    Esophageal cancer Neg Hx    Rectal cancer Neg Hx    Stomach cancer Neg Hx     Social History   Socioeconomic History   Marital status: Married    Spouse name: Not on file   Number of children: Not on file   Years of education: Not on file   Highest education level: Not on file  Occupational History   Not on file  Tobacco Use   Smoking status: Never    Smokeless tobacco: Never  Vaping Use   Vaping status: Never Used  Substance and Sexual Activity   Alcohol use: Not Currently   Drug use: Never   Sexual activity: Not Currently  Other Topics Concern   Not on file  Social History Narrative   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    Accountant   2 cats and a dog   Social Drivers of Corporate investment banker Strain: Low Risk  (06/25/2022)   Overall Financial Resource Strain (CARDIA)    Difficulty of Paying Living Expenses: Not hard at all  Food Insecurity: No Food Insecurity (06/25/2022)   Hunger Vital Sign    Worried About Running Out of Food in the Last Year: Never true    Ran Out of Food in the Last Year: Never true  Transportation Needs: No Transportation Needs (06/25/2022)   PRAPARE - Administrator, Civil Service (Medical): No    Lack of Transportation (Non-Medical):  No  Physical Activity: Sufficiently Active (06/25/2022)   Exercise Vital Sign    Days of Exercise per Week: 7 days    Minutes of Exercise per Session: 40 min  Stress: No Stress Concern Present (06/25/2022)   [employer] of Occupational Health - Occupational Stress Questionnaire    Feeling of Stress : Not at all  Social Connections: Socially Integrated (06/25/2022)   Social Connection and Isolation Panel    Frequency of Communication with Friends and Family: More than three times a week    Frequency of Social Gatherings with Friends and Family: Three times a week    Attends Religious Services: More than 4 times per year    Active Member of Clubs or Organizations: Yes    Attends Banker Meetings: More than 4 times per year    Marital Status: Married    Outpatient Medications Prior to Visit  Medication Sig Dispense Refill   atorvastatin  (LIPITOR) 20 MG tablet Take 1 tablet (20 mg total) by mouth daily. 90 tablet 3   hydrochlorothiazide  (HYDRODIURIL ) 25 MG tablet Take 1 tablet (25 mg total) by mouth daily. 90 tablet 3   metFORMIN  (GLUCOPHAGE )  500 MG tablet TAKE 2 TABLETS BY MOUTH EVERY MORNING AND TAKE 2 TABLETS EVERY EVENING 360 tablet 3   telmisartan  (MICARDIS ) 80 MG tablet Take 1 tablet (80 mg total) by mouth daily. 90 tablet 3   Facility-Administered Medications Prior to Visit  Medication Dose Route Frequency Provider Last Rate Last Admin   0.9 %  sodium chloride  infusion  500 mL Intravenous Once Armbruster, Elspeth SQUIBB, MD         EXAM:  BP 128/76 (BP Location: Left Arm, Patient Position: Sitting, Cuff Size: Large)   Pulse 97   Temp 98.3 F (36.8 C) (Oral)   Ht 5' 4.99 (1.651 m)   Wt 215 lb 12.8 oz (97.9 kg)   SpO2 98%   BMI 35.92 kg/m   Body mass index is 35.92 kg/m.  GENERAL: vitals reviewed and listed above, alert, oriented, appears well hydrated and in no acute distress HEENT: atraumatic, conjunctiva  clear, no obvious abnormalities on inspection of external nose and ears OP : no lesion edema or exudate  NECK: no obvious masses on inspection palpation   CV: HRRR, soft 2/6 sem usb  r and left ? Min radiation  no clubbing cyanosis nl cap refill  MS: moves all extremities without noticeable focal  abnormality PSYCH: pleasant and cooperative, no obvious depression or anxiety Lab Results  Component Value Date   WBC 8.2 07/17/2023   HGB 13.8 07/17/2023   HCT 42.1 07/17/2023   PLT 326.0 07/17/2023   GLUCOSE 111 (H) 07/17/2023   CHOL 166 07/17/2023   TRIG 135.0 07/17/2023   HDL 42.50 07/17/2023   LDLCALC 97 07/17/2023   ALT 15 07/17/2023   AST 14 07/17/2023   NA 138 07/17/2023   K 3.9 07/17/2023   CL 97 07/17/2023   CREATININE 1.06 07/17/2023   BUN 19 07/17/2023   CO2 29 07/17/2023   TSH 4.81 07/17/2023   PSA 0.06 (L) 07/17/2023   HGBA1C 6.4 (A) 10/21/2023   MICROALBUR 5.8 (H) 07/17/2023   BP Readings from Last 3 Encounters:  10/21/23 128/76  07/17/23 (!) 150/78  12/25/22 132/76    ASSESSMENT AND PLAN:  Discussed the following assessment and plan:  Type 2 diabetes mellitus with  hyperglycemia, without long-term current use of insulin (HCC) - last urine protein borderline elevated at 32 ratio repat  today - Plan: POC HgB A1c, Microalbumin / creatinine urine ratio  Medication management  Essential hypertension - Plan: Microalbumin / creatinine urine ratio  Heart murmur - soft no progressive and no sx reported declines echo  will follow To get lfu vaccine  later  A1c better  6.4 down from 7.0  Repeat urine microalbumin ratio Optimize lifestyle discussion. Plan cpe  and lab pre visit if calls ahead  in  April 26  or as needed   -Patient advised to return or notify health care team  if  new concerns arise.  Patient Instructions  A1c is much better  6.4    Continue lifestyle intervention healthy eating and exercise .  Update urine for protein today .   Plan 6 months check . April 26 .    Yahia Bottger K. Terrian Ridlon [name]

## 2023-10-21 NOTE — Progress Notes (Signed)
 Urine protein test is now normal  . Good finding . See you next April or as needed

## 2023-10-23 NOTE — Telephone Encounter (Signed)
 Copied from CRM [id number]. Topic: Clinical - Lab/Test Results >> Oct 23, 2023  4:52 PM Alfonso HERO wrote: Reason for CRM: calling back for results

## 2023-11-05 DIAGNOSIS — L578 Other skin changes due to chronic exposure to nonionizing radiation: Secondary | ICD-10-CM | POA: Diagnosis not present

## 2023-11-05 DIAGNOSIS — L728 Other follicular cysts of the skin and subcutaneous tissue: Secondary | ICD-10-CM | POA: Diagnosis not present

## 2023-12-23 DIAGNOSIS — E119 Type 2 diabetes mellitus without complications: Secondary | ICD-10-CM | POA: Diagnosis not present

## 2023-12-23 LAB — OPHTHALMOLOGY REPORT-SCANNED

## 2024-07-19 ENCOUNTER — Ambulatory Visit

## 2024-07-20 ENCOUNTER — Encounter: Admitting: Internal Medicine
